# Patient Record
Sex: Male | Born: 1980 | Race: White | Hispanic: Yes | Marital: Married | Smoking: Former smoker
Health system: Southern US, Community
[De-identification: ages and names within clinical notes are randomized; demographics above are authoritative.]

## PROBLEM LIST (undated history)

## (undated) DIAGNOSIS — I10 Essential (primary) hypertension: Secondary | ICD-10-CM

## (undated) DIAGNOSIS — N200 Calculus of kidney: Secondary | ICD-10-CM

## (undated) HISTORY — PX: HERNIA REPAIR: SHX51

---

## 2006-01-09 ENCOUNTER — Emergency Department (HOSPITAL_COMMUNITY): Admission: EM | Admit: 2006-01-09 | Discharge: 2006-01-09 | Payer: Self-pay | Admitting: Emergency Medicine

## 2006-03-17 ENCOUNTER — Emergency Department (HOSPITAL_COMMUNITY): Admission: EM | Admit: 2006-03-17 | Discharge: 2006-03-17 | Payer: Self-pay | Admitting: Family Medicine

## 2007-01-16 ENCOUNTER — Emergency Department (HOSPITAL_COMMUNITY): Admission: EM | Admit: 2007-01-16 | Discharge: 2007-01-16 | Payer: Self-pay | Admitting: Emergency Medicine

## 2010-12-27 ENCOUNTER — Emergency Department (HOSPITAL_COMMUNITY)
Admission: EM | Admit: 2010-12-27 | Discharge: 2010-12-27 | Disposition: A | Discharge status: Home / Self Care | Payer: Self-pay | Attending: Emergency Medicine | Admitting: Emergency Medicine

## 2010-12-27 DIAGNOSIS — W268XXA Contact with other sharp object(s), not elsewhere classified, initial encounter: Secondary | ICD-10-CM | POA: Insufficient documentation

## 2010-12-27 DIAGNOSIS — S61509A Unspecified open wound of unspecified wrist, initial encounter: Secondary | ICD-10-CM | POA: Insufficient documentation

## 2010-12-27 DIAGNOSIS — Y92009 Unspecified place in unspecified non-institutional (private) residence as the place of occurrence of the external cause: Secondary | ICD-10-CM | POA: Insufficient documentation

## 2011-02-25 LAB — I-STAT 8, (EC8 V) (CONVERTED LAB)
Acid-Base Excess: 4 — ABNORMAL HIGH
BUN: 18
Bicarbonate: 26.1 — ABNORMAL HIGH
Chloride: 103
Glucose, Bld: 106 — ABNORMAL HIGH
HCT: 49
Hemoglobin: 16.7
Operator id: 279831
Potassium: 3.8
Sodium: 138
TCO2: 27
pCO2, Ven: 30.5 — ABNORMAL LOW
pH, Ven: 7.54 — ABNORMAL HIGH

## 2011-02-25 LAB — DIFFERENTIAL
Band Neutrophils: 0
Basophils Relative: 0
Blasts: 0
Eosinophils Relative: 1
Lymphocytes Relative: 24
Metamyelocytes Relative: 0
Monocytes Relative: 20 — ABNORMAL HIGH
Myelocytes: 0
Neutrophils Relative %: 55
Promyelocytes Absolute: 0
nRBC: 0

## 2011-02-25 LAB — CBC
HCT: 44.8
Hemoglobin: 15.5
MCHC: 34.6
MCV: 85.2
Platelets: 251
RBC: 5.26
RDW: 13.7
WBC: 6.2

## 2011-02-25 LAB — POCT CARDIAC MARKERS
CKMB, poc: <1 — ABNORMAL LOW
Myoglobin, poc: 33.9
Troponin i, poc: <0.05

## 2011-02-25 LAB — POCT I-STAT CREATININE
Creatinine, Ser: 0.9
Operator id: 279831

## 2011-02-25 LAB — D-DIMER, QUANTITATIVE: D-Dimer, Quant: 0.74 — ABNORMAL HIGH

## 2011-04-13 ENCOUNTER — Encounter (HOSPITAL_COMMUNITY): Payer: Self-pay | Admitting: Emergency Medicine

## 2011-04-13 ENCOUNTER — Emergency Department (INDEPENDENT_AMBULATORY_CARE_PROVIDER_SITE_OTHER): Payer: Self-pay

## 2011-04-13 ENCOUNTER — Emergency Department (INDEPENDENT_AMBULATORY_CARE_PROVIDER_SITE_OTHER)
Admission: EM | Admit: 2011-04-13 | Discharge: 2011-04-13 | Disposition: A | Discharge status: Home / Self Care | Payer: Self-pay | Source: Home / Self Care | Attending: Emergency Medicine | Admitting: Emergency Medicine

## 2011-04-13 DIAGNOSIS — S2239XA Fracture of one rib, unspecified side, initial encounter for closed fracture: Secondary | ICD-10-CM

## 2011-04-13 DIAGNOSIS — S2249XA Multiple fractures of ribs, unspecified side, initial encounter for closed fracture: Secondary | ICD-10-CM

## 2011-04-13 HISTORY — DX: Calculus of kidney: N20.0

## 2011-04-13 LAB — POCT URINALYSIS DIP (DEVICE)
Bilirubin Urine: NEGATIVE
Glucose, UA: NEGATIVE mg/dL
Hgb urine dipstick: NEGATIVE
Ketones, ur: NEGATIVE mg/dL
Leukocytes, UA: NEGATIVE
Nitrite: NEGATIVE
Protein, ur: NEGATIVE mg/dL
Specific Gravity, Urine: 1.015 (ref 1.005–1.030)
Urobilinogen, UA: 0.2 mg/dL (ref 0.0–1.0)
pH: 7 (ref 5.0–8.0)

## 2011-04-13 MED ORDER — OXYCODONE-ACETAMINOPHEN 5-325 MG PO TABS: 1.0000 | ORAL_TABLET | Freq: Four times a day (QID) | ORAL | Status: AC | PRN

## 2011-04-13 MED ORDER — IBUPROFEN 800 MG PO TABS: 800.0000 mg | ORAL_TABLET | Freq: Three times a day (TID) | ORAL | Status: AC | PRN

## 2011-04-13 NOTE — ED Provider Notes (Signed)
History     CSN: [id number] Arrival date & time: 04/13/2011 12:17 PM   First MD Initiated Contact with Patient 04/13/11 1007      Chief Complaint  Patient presents with  . Chest Pain    HPI Comments: Pt states was in his attic and accidenatlly fell through the celing 1 week ago. Sustained abrasion to right posterior chest wall. C/o sharp localised pain there and anteriorally between ribs 8-10. Has been taking motrin and percocet left over from prev kidney stone w/ temporary relief.  Woke up with morning, and heard a "pop" when moving. Pain then returned. No cough, wheeze, SOB, hemoptysis, N/V, fevers.   Patient is a 30 y.o. male presenting with chest pain. The history is provided by the patient.  Chest Pain The chest pain began 5 - 7 days ago. The pain is associated with breathing and coughing. The quality of the pain is described as sharp. The pain does not radiate. Chest pain is worsened by deep breathing and certain positions. Pertinent negatives for primary symptoms include no fever, no fatigue, no shortness of breath, no cough, no wheezing, no abdominal pain, no nausea and no vomiting. He tried NSAIDs and narcotics for the symptoms. Risk factors include no known risk factors.     Past Medical History  Diagnosis Date  . Kidney stones     History reviewed. No pertinent past surgical history.  History reviewed. No pertinent family history.  History  Substance Use Topics  . Smoking status: Never Smoker   . Smokeless tobacco: Not on file  . Alcohol Use: No      Review of Systems  Constitutional: Negative for fever and fatigue.  Respiratory: Negative for cough, shortness of breath and wheezing.   Cardiovascular: Positive for chest pain.  Gastrointestinal: Negative for nausea, vomiting and abdominal pain.  Genitourinary: Negative for hematuria.  Skin: Positive for wound.    Allergies  Review of patient's allergies indicates no known allergies.  Home Medications    Current Outpatient Rx  Name Route Sig Dispense Refill  . IBUPROFEN 200 MG PO TABS Oral Take 600 mg by mouth every 6 (six) hours as needed.      . OXYCODONE-ACETAMINOPHEN 5-325 MG PO TABS Oral Take 1 tablet by mouth every 4 (four) hours as needed.        BP 119/73  Pulse 77  Temp(Src) 98.6 F (37 C) (Oral)  Resp 16  SpO2 100%  Physical Exam  Nursing note and vitals reviewed. Constitutional: He is oriented to person, place, and time. He appears well-developed and well-nourished.  HENT:  Head: Normocephalic and atraumatic.  Eyes: Conjunctivae and EOM are normal. Pupils are equal, round, and reactive to light.  Neck: Normal range of motion.  Cardiovascular: Normal rate, regular rhythm and normal heart sounds.   Pulmonary/Chest: Effort normal and breath sounds normal. No respiratory distress.       Abrasion posteriorally around rib 8 right side. Localised tenderness. Tenderness anteriorally around rib 9-10 ? Crepitus. No bruising, deformity.  Abdominal: Soft. He exhibits no distension.  Musculoskeletal: Normal range of motion.  Neurological: He is alert and oriented to person, place, and time.  Skin: Skin is warm and dry.  Psychiatric: He has a normal mood and affect. His behavior is normal.    ED Course  Procedures (including critical care time)  Labs Reviewed - No data to display No results found.   No diagnosis found.  Dg Ribs Unilateral W/chest Right  04/13/2011  *RADIOLOGY REPORT*  Clinical Data: Trauma, fell through ceiling landing on a chair 1 week ago, pain and lower posterior lateral right ribs  RIGHT RIBS AND CHEST - 3+ VIEW  Comparison: Chest radiograph 01/16/2007  Findings: Upper-normal size of cardiac silhouette. Mediastinal contours and pulmonary vascularity normal. Minimal peribronchial thickening. Lungs clear. No pleural effusion or pneumothorax. BB placed at site of symptoms lower lateral right chest. Nondisplaced fractures lateral right seventh and eighth  ribs.  IMPRESSION: Minimal bronchitic changes. Nondisplaced fractures lateral right seventh and eighth ribs.  Original Report Authenticated By: Lollie Marrow, M.D.   Results for orders placed during the hospital encounter of 04/13/11  POCT URINALYSIS DIP (DEVICE)      Component Value Range   Glucose, UA NEGATIVE  NEGATIVE (mg/dL)   Bilirubin Urine NEGATIVE  NEGATIVE    Ketones, ur NEGATIVE  NEGATIVE (mg/dL)   Specific Gravity, Urine 1.015  1.005 - 1.030    Hgb urine dipstick NEGATIVE  NEGATIVE    pH 7.0  5.0 - 8.0    Protein, ur NEGATIVE  NEGATIVE (mg/dL)   Urobilinogen, UA 0.2  0.0 - 1.0 (mg/dL)   Nitrite NEGATIVE  NEGATIVE    Leukocytes, UA NEGATIVE  NEGATIVE      MDM  Pt seen and examined. Pt declined pain medication at this time. Checking XR to f/o fx, ua to r/o renal contusion.   Luiz Blare, MD 04/13/11 1400

## 2014-09-17 ENCOUNTER — Encounter: Payer: Self-pay | Admitting: Family

## 2014-09-17 ENCOUNTER — Ambulatory Visit (INDEPENDENT_AMBULATORY_CARE_PROVIDER_SITE_OTHER): Payer: BLUE CROSS/BLUE SHIELD | Admitting: Family

## 2014-09-17 VITALS — BP 132/78 | HR 73 | Temp 98.2°F | Resp 18 | Ht 64.0 in | Wt 182.0 lb

## 2014-09-17 DIAGNOSIS — R21 Rash and other nonspecific skin eruption: Secondary | ICD-10-CM

## 2014-09-17 DIAGNOSIS — K047 Periapical abscess without sinus: Secondary | ICD-10-CM | POA: Diagnosis not present

## 2014-09-17 MED ORDER — AMOXICILLIN-POT CLAVULANATE 875-125 MG PO TABS: 1.0000 | ORAL_TABLET | Freq: Two times a day (BID) | ORAL | Status: DC

## 2014-09-17 NOTE — Progress Notes (Signed)
Subjective:    Patient ID: Kyle Ortiz, male    DOB: 02-07-1981, 34 y.o.   MRN: [id number]  Chief Complaint  Patient presents with  . Establish Care    has a really bad tooth ache that has been going on for x3 days, says the pain gets so bad that he feels like he could pass out, he is having insurance issues right now and can not go to the dentist, also has a rash around private area and inbetween legs, x3 months or longer    HPI:  Kyle Ortiz is a 34 y.o. male who presents today for an office visit to establish care.  1.) Toothache - This is a new problem. Associated symptom of a toothache located on the right side of his mouth has been going on for about 3 days. Describes the pain is described as achy with a severity strong enough to make him feel as if he is going to pass out on occasion. Right now the pain is minimal. Modifying factors include Motrin which helped with the pain. He currently does not have dental insurance.  Reports his last dental cleaning was about 6-8 months ago.   2) Rash - This is a new problem. Associated symptom of a rash around his testicles that spreads to his inner thighs has been going on for about 3 months and longer. Describes it as coming and going. Describes the rash as itchy. He has used his wife's steroid cream which helped take away the itch. States that he does sweat more in that area.   No Known Allergies  No current outpatient prescriptions on file prior to visit.   No current facility-administered medications on file prior to visit.    Past Medical History  Diagnosis Date  . Kidney stones     History reviewed. No pertinent past surgical history.  History reviewed. No pertinent family history.  History   Social History  . Marital Status: Married    Spouse Name: N/A  . Number of Children: 1  . Years of Education: 14   Occupational History  . Technician     Social History Main Topics  . Smoking status: Never Smoker   . Smokeless  tobacco: Never Used  . Alcohol Use: Yes     Comment: Occasional  . Drug Use: Yes    Special: Marijuana  . Sexual Activity: Not on file   Other Topics Concern  . Not on file   Social History Narrative   Currently resides with his wife and child. Fun: Going to gym; travel, going out to eat   Denies religious beliefs effecting health care.     Review of Systems  Constitutional: Negative for fever and chills.  HENT: Positive for dental problem.   Skin: Positive for rash.      Objective:    BP 172/108 mmHg  Pulse 73  Temp(Src) 98.2 F (36.8 C) (Oral)  Resp 18  Ht 5\' 4"  (1.626 m)  Wt 182 lb (82.555 kg)  BMI 31.22 kg/m2  SpO2 98% Nursing note and vital signs reviewed.  Physical Exam  Constitutional: He is oriented to person, place, and time. He appears well-developed and well-nourished. No distress.  HENT:  Mouth/Throat: Oropharynx is clear and moist and mucous membranes are normal. Abnormal dentition. No dental abscesses. No oropharyngeal exudate.  Right lower wisdom tooth appears with potential dental caries with inflammation noted.   Cardiovascular: Normal rate, regular rhythm, normal heart sounds and intact distal pulses.  Pulmonary/Chest: Effort normal and breath sounds normal.  Genitourinary:  Slightly reddend rash with small papules. No obvious sign of infection noted.   Neurological: He is alert and oriented to person, place, and time.  Skin: Skin is warm and dry.  Psychiatric: He has a normal mood and affect. His behavior is normal. Judgment and thought content normal.       Assessment & Plan:

## 2014-09-17 NOTE — Patient Instructions (Signed)
  Thank you for choosing [facility].  Summary/Instructions:  Please follow up with a Dentist.   Your prescription(s) have been submitted to your pharmacy or been printed and provided for you. Please take as directed and contact our office if you believe you are having problem(s) with the medication(s) or have any questions.  If your symptoms worsen or fail to improve, please contact our office for further instruction, or in case of emergency go directly to the emergency room at the closest medical facility.     For your groin - please try Tinactin powder to enhance dryness. Start a Lotramin (clotrimazole) cream.    Dental Abscess A dental abscess is a collection of infected fluid (pus) from a bacterial infection in the inner part of the tooth (pulp). It usually occurs at the end of the tooth's root.  CAUSES   Severe tooth decay.  Trauma to the tooth that allows bacteria to enter into the pulp, such as a broken or chipped tooth. SYMPTOMS   Severe pain in and around the infected tooth.  Swelling and redness around the abscessed tooth or in the mouth or face.  Tenderness.  Pus drainage.  Bad breath.  Bitter taste in the mouth.  Difficulty swallowing.  Difficulty opening the mouth.  Nausea.  Vomiting.  Chills.  Swollen neck glands. DIAGNOSIS   A medical and dental history will be taken.  An examination will be performed by tapping on the abscessed tooth.  X-rays may be taken of the tooth to identify the abscess. TREATMENT The goal of treatment is to eliminate the infection. You may be prescribed antibiotic medicine to stop the infection from spreading. A root canal may be performed to save the tooth. If the tooth cannot be saved, it may be pulled (extracted) and the abscess may be drained.  HOME CARE INSTRUCTIONS  Only take over-the-counter or prescription medicines for pain, fever, or discomfort as directed by your caregiver.  Rinse your mouth (gargle)  often with salt water ( tsp salt in 8 oz [250 ml] of warm water) to relieve pain or swelling.  Do not drive after taking pain medicine (narcotics).  Do not apply heat to the outside of your face.  Return to your dentist for further treatment as directed. SEEK MEDICAL CARE IF:  Your pain is not helped by medicine.  Your pain is getting worse instead of better. SEEK IMMEDIATE MEDICAL CARE IF:  You have a fever or persistent symptoms for more than 2-3 days.  You have a fever and your symptoms suddenly get worse.  You have chills or a very bad headache.  You have problems breathing or swallowing.  You have trouble opening your mouth.  You have swelling in the neck or around the eye. Document Released: 05/02/2005 Document Revised: 01/25/2012 Document Reviewed: 08/10/2010 [facility] Patient Information 2015 [place], [place]. This information is not intended to replace advice given to you by your health care provider. Make sure you discuss any questions you have with your health care provider.

## 2014-09-17 NOTE — Progress Notes (Signed)
Pre visit review using our clinic review tool, if applicable. No additional management support is needed unless otherwise documented below in the visit note. 

## 2014-09-17 NOTE — Assessment & Plan Note (Signed)
Right lower wisdom tooth appears infected. Start Augmentin. Follow up with dentist for potential removal of tooth. Ibuprofen 600-800 mg three times daily as needed for pain. Follow up if symptoms worsen or fail to improve.

## 2014-09-17 NOTE — Assessment & Plan Note (Signed)
Questionable tinea corpus of groin noted. Start clotrimazole. Instructed to keep area clean and try. Consider tinactin powder as needed.  Follow up if symptoms worsen or fail to improve.

## 2014-12-24 ENCOUNTER — Emergency Department (HOSPITAL_COMMUNITY)
Admission: EM | Admit: 2014-12-24 | Discharge: 2014-12-24 | Disposition: A | Discharge status: Home / Self Care | Payer: BLUE CROSS/BLUE SHIELD | Source: Home / Self Care | Attending: Family Medicine | Admitting: Family Medicine

## 2014-12-24 ENCOUNTER — Encounter (HOSPITAL_COMMUNITY): Payer: Self-pay | Admitting: Emergency Medicine

## 2014-12-24 ENCOUNTER — Emergency Department (HOSPITAL_COMMUNITY)
Admission: EM | Admit: 2014-12-24 | Discharge: 2014-12-24 | Disposition: A | Discharge status: Home / Self Care | Payer: Self-pay

## 2014-12-24 DIAGNOSIS — S0501XA Injury of conjunctiva and corneal abrasion without foreign body, right eye, initial encounter: Secondary | ICD-10-CM | POA: Diagnosis not present

## 2014-12-24 DIAGNOSIS — S0181XA Laceration without foreign body of other part of head, initial encounter: Secondary | ICD-10-CM

## 2014-12-24 MED ORDER — POLYMYXIN B-TRIMETHOPRIM 10000-0.1 UNIT/ML-% OP SOLN: 2.0000 [drp] | Freq: Four times a day (QID) | OPHTHALMIC | Status: DC

## 2014-12-24 MED ORDER — FLUORESCEIN SODIUM 1 MG OP STRP
ORAL_STRIP | OPHTHALMIC | Status: AC
  Filled 2014-12-24: qty 1

## 2014-12-24 MED ORDER — TETRACAINE HCL 0.5 % OP SOLN
OPHTHALMIC | Status: AC
  Filled 2014-12-24: qty 2

## 2014-12-24 NOTE — ED Provider Notes (Signed)
CSN: [id number]     Arrival date & time 12/24/14  1303 History   First MD Initiated Contact with Patient 12/24/14 1326     Chief Complaint  Patient presents with  . Eye Injury   (Consider location/radiation/quality/duration/timing/severity/associated sxs/prior Treatment) HPI Approximately 12 hours ago patient was working in his shop with a small perineal nose pliers and was trying to bend another piece of metal metal when the pliers flew out of his hand and struck him in the cheek and right eye. Areas were immediately painful. Small bleeding from his cheek. This stopped with pressure. Patient states that he rates his eye with water which resolved the stinging in his eye. Denies any vision change, persistent pain, headache, nausea, vomiting. Last tetanus was in 2012.      Past Medical History  Diagnosis Date  . Kidney stones    History reviewed. No pertinent past surgical history. Family History  Problem Relation Age of Onset  . Cancer Neg Hx   . Diabetes Neg Hx   . Heart failure Neg Hx   . Hyperlipidemia Neg Hx    Social History  Substance Use Topics  . Smoking status: Never Smoker   . Smokeless tobacco: Never Used  . Alcohol Use: Yes     Comment: Occasional    Review of Systems Per HPI with all other pertinent systems negative.   Allergies  Review of patient's allergies indicates no known allergies.  Home Medications   Prior to Admission medications   Medication Sig Start Date End Date Taking? Authorizing Provider  trimethoprim-polymyxin b (POLYTRIM) ophthalmic solution Place 2 drops into the right eye every 6 (six) hours. Treat for 5-7 days 12/24/14   Ozella Rocks, MD   BP 136/80 mmHg  Pulse 82  Temp(Src) 97.4 F (36.3 C) (Oral)  Resp 18  SpO2 98% Physical Exam Physical Exam  Constitutional: oriented to person, place, and time. appears well-developed and well-nourished. No distress.  HENT:  Head: Normocephalic and atraumatic.  Eyes: Scleral injection,  floor seen exam yielding right medial corneal abrasion. Small. No vitreous humor leak appreciated.  Neck: Normal range of motion.  Cardiovascular: RRR, no m/r/g, 2+ distal pulses,  Pulmonary/Chest: Effort normal and breath sounds normal. No respiratory distress.  Abdominal: Soft. Bowel sounds are normal. NonTTP, no distension.  Musculoskeletal: Normal range of motion. Non ttp, no effusion.  Neurological: alert and oriented to person, place, and time.  Skin: 0.6 cm right upper cheek laceration.Marland Kitchen  Psychiatric: normal mood and affect. behavior is normal. Judgment and thought content normal.   ED Course  Procedures (including critical care time) Labs Review Labs Reviewed - No data to display  Imaging Review No results found.   MDM   1. Corneal abrasion, right, initial encounter   2. Facial laceration, initial encounter    Small cheek laceration. There was cleaned with copious mouth normal saline and sterile bandages and a small single Steri-Strip was applied with close wound approximation. Patient has sustained a corneal abrasion would benefit from Polytrim drops. No need for ophthalmology follow-up unless patient develops other symptoms. Tetanus is up-to-date.    Ozella Rocks, MD 12/24/14 1400

## 2014-12-24 NOTE — Discharge Instructions (Signed)
You have a very small scratch to the outer layer of the eye, corneal abrasion. Please have the antibiotic drops to clear out and prevent any infection. He also had a small laceration to your cheek that went into the deep tissue of the cheek. This was closed with a Steri-Strip. This will come off in 5-10 days. Please watch for signs of infection though this is unlikely. There are no specific wound treatments you need to perform for your cheek.

## 2014-12-24 NOTE — ED Notes (Signed)
Pt was working on his vehicle when his hand slipped and he hit his right eye with his needle nose pliers.  The eye is red, but he states it is not painful.  He also has a very small laceration on his cheek, below his eye.

## 2015-01-20 ENCOUNTER — Encounter: Payer: BLUE CROSS/BLUE SHIELD | Admitting: Family

## 2015-01-20 DIAGNOSIS — Z0289 Encounter for other administrative examinations: Secondary | ICD-10-CM

## 2015-01-22 ENCOUNTER — Emergency Department (HOSPITAL_COMMUNITY)
Admission: EM | Admit: 2015-01-22 | Discharge: 2015-01-22 | Disposition: A | Discharge status: Home / Self Care | Payer: BLUE CROSS/BLUE SHIELD | Attending: Emergency Medicine | Admitting: Emergency Medicine

## 2015-01-22 ENCOUNTER — Encounter (HOSPITAL_COMMUNITY): Payer: Self-pay

## 2015-01-22 DIAGNOSIS — Y288XXA Contact with other sharp object, undetermined intent, initial encounter: Secondary | ICD-10-CM | POA: Diagnosis not present

## 2015-01-22 DIAGNOSIS — Y9289 Other specified places as the place of occurrence of the external cause: Secondary | ICD-10-CM | POA: Insufficient documentation

## 2015-01-22 DIAGNOSIS — S61411A Laceration without foreign body of right hand, initial encounter: Secondary | ICD-10-CM | POA: Insufficient documentation

## 2015-01-22 DIAGNOSIS — Y998 Other external cause status: Secondary | ICD-10-CM | POA: Insufficient documentation

## 2015-01-22 DIAGNOSIS — I10 Essential (primary) hypertension: Secondary | ICD-10-CM | POA: Diagnosis not present

## 2015-01-22 DIAGNOSIS — Y9389 Activity, other specified: Secondary | ICD-10-CM | POA: Diagnosis not present

## 2015-01-22 DIAGNOSIS — S6991XA Unspecified injury of right wrist, hand and finger(s), initial encounter: Secondary | ICD-10-CM | POA: Diagnosis present

## 2015-01-22 DIAGNOSIS — Z87442 Personal history of urinary calculi: Secondary | ICD-10-CM | POA: Diagnosis not present

## 2015-01-22 HISTORY — DX: Essential (primary) hypertension: I10

## 2015-01-22 MED ORDER — LIDOCAINE HCL 1 % IJ SOLN
INTRAMUSCULAR | Status: AC
  Filled 2015-01-22: qty 20

## 2015-01-22 NOTE — ED Notes (Signed)
Pt c/o lightheaded and nausea. Pt states hx of same. Water and juice given.

## 2015-01-22 NOTE — ED Provider Notes (Signed)
CSN: [id number]     Arrival date & time 01/22/15  5784 History   First MD Initiated Contact with Patient 01/22/15 [phone]     Chief Complaint  Patient presents with  . Laceration     (Consider location/radiation/quality/duration/timing/severity/associated sxs/prior Treatment) Patient is a 34 y.o. male presenting with skin laceration. The history is provided by the patient. No language interpreter was used.  Laceration Location:  Hand Hand laceration location:  R hand Depth:  Cutaneous Quality: straight   Bleeding: controlled   Time since incident:  1 hour Laceration mechanism:  Metal edge Pain details:    Quality:  Dull Foreign body present:  No foreign bodies Tetanus status:  Up to date   Past Medical History  Diagnosis Date  . Kidney stones   . Hypertension    History reviewed. No pertinent past surgical history. Family History  Problem Relation Age of Onset  . Cancer Neg Hx   . Diabetes Neg Hx   . Heart failure Neg Hx   . Hyperlipidemia Neg Hx    Social History  Substance Use Topics  . Smoking status: Never Smoker   . Smokeless tobacco: Never Used  . Alcohol Use: Yes     Comment: Occasional    Review of Systems  Skin: Positive for wound.  All other systems reviewed and are negative.     Allergies  Review of patient's allergies indicates no known allergies.  Home Medications   Prior to Admission medications   Medication Sig Start Date End Date Taking? Authorizing Provider  trimethoprim-polymyxin b (POLYTRIM) ophthalmic solution Place 2 drops into the right eye every 6 (six) hours. Treat for 5-7 days 12/24/14   Ozella Rocks, MD   BP 140/92 mmHg  Pulse 82  Temp(Src) 97.8 F (36.6 C) (Oral)  Resp 18  Wt 175 lb (79.379 kg)  SpO2 99% Physical Exam  Constitutional: He is oriented to person, place, and time. He appears well-nourished. No distress.  HENT:  Head: Normocephalic.  Eyes: Conjunctivae are normal.  Neck: Neck supple.  Cardiovascular:  Normal rate and regular rhythm.   Pulmonary/Chest: Effort normal and breath sounds normal.  Abdominal: Soft. Bowel sounds are normal.  Musculoskeletal: He exhibits tenderness. He exhibits no edema.       Right hand: He exhibits laceration. He exhibits normal range of motion. Normal sensation noted. Normal strength noted.       Hands: Lymphadenopathy:    He has no cervical adenopathy.  Neurological: He is alert and oriented to person, place, and time.  Skin: Skin is warm and dry.  Psychiatric: He has a normal mood and affect.  Nursing note and vitals reviewed.   ED Course  Procedures (including critical care time)  LACERATION REPAIR Performed by: Jimmye Norman Authorized by: Jimmye Norman Consent: Verbal consent obtained. Risks and benefits: risks, benefits and alternatives were discussed Consent given by: patient Patient identity confirmed: provided demographic data Prepped and Draped in normal sterile fashion Wound explored  Laceration Location: right hand above 2nd MP  Laceration Length: 2 cm  No Foreign Bodies seen or palpated  Anesthesia: local infiltration  Local anesthetic: lidocaine 1%   Anesthetic total: 2 ml  Irrigation method: syringe  Amount of cleaning: standard  Skin closure: 4.0 prolene  Number of sutures: 3  Technique: simple interrupted  Patient tolerance: Patient tolerated the procedure well with no immediate complications. Labs Review Labs Reviewed - No data to display  Imaging Review No results found. I have personally reviewed and  evaluated these images and lab results as part of my medical decision-making.   EKG Interpretation None      MDM   Final diagnoses:  None    Right hand laceration. No tendon involvement. Sutured wound closure. Tetanus up to date. Suture removal in 5-7 days. Return precautions discussed.    Felicie Morn, NP 01/22/15 1050  Laurence Spates, MD 01/22/15 [phone]

## 2015-01-22 NOTE — Discharge Instructions (Signed)
Laceration Care, Adult °A laceration is a cut or lesion that goes through all layers of the skin and into the tissue just beneath the skin. °TREATMENT  °Some lacerations may not require closure. Some lacerations may not be able to be closed due to an increased risk of infection. It is important to see your caregiver as soon as possible after an injury to minimize the risk of infection and maximize the opportunity for successful closure. °If closure is appropriate, pain medicines may be given, if needed. The wound will be cleaned to help prevent infection. Your caregiver will use stitches (sutures), staples, wound glue (adhesive), or skin adhesive strips to repair the laceration. These tools bring the skin edges together to allow for faster healing and a better cosmetic outcome. However, all wounds will heal with a scar. Once the wound has healed, scarring can be minimized by covering the wound with sunscreen during the day for 1 full year. °HOME CARE INSTRUCTIONS  °For sutures or staples: °· Keep the wound clean and dry. °· If you were given a bandage (dressing), you should change it at least once a day. Also, change the dressing if it becomes wet or dirty, or as directed by your caregiver. °· Wash the wound with soap and water 2 times a day. Rinse the wound off with water to remove all soap. Pat the wound dry with a clean towel. °· After cleaning, apply a thin layer of the antibiotic ointment as recommended by your caregiver. This will help prevent infection and keep the dressing from sticking. °· You may shower as usual after the first 24 hours. Do not soak the wound in water until the sutures are removed. °· Only take over-the-counter or prescription medicines for pain, discomfort, or fever as directed by your caregiver. °· Get your sutures or staples removed as directed by your caregiver. °For skin adhesive strips: °· Keep the wound clean and dry. °· Do not get the skin adhesive strips wet. You may bathe  carefully, using caution to keep the wound dry. °· If the wound gets wet, pat it dry with a clean towel. °· Skin adhesive strips will fall off on their own. You may trim the strips as the wound heals. Do not remove skin adhesive strips that are still stuck to the wound. They will fall off in time. °For wound adhesive: °· You may briefly wet your wound in the shower or bath. Do not soak or scrub the wound. Do not swim. Avoid periods of heavy perspiration until the skin adhesive has fallen off on its own. After showering or bathing, gently pat the wound dry with a clean towel. °· Do not apply liquid medicine, cream medicine, or ointment medicine to your wound while the skin adhesive is in place. This may loosen the film before your wound is healed. °· If a dressing is placed over the wound, be careful not to apply tape directly over the skin adhesive. This may cause the adhesive to be pulled off before the wound is healed. °· Avoid prolonged exposure to sunlight or tanning lamps while the skin adhesive is in place. Exposure to ultraviolet light in the first year will darken the scar. °· The skin adhesive will usually remain in place for 5 to 10 days, then naturally fall off the skin. Do not pick at the adhesive film. °You may need a tetanus shot if: °· You cannot remember when you had your last tetanus shot. °· You have never had a tetanus   shot. If you get a tetanus shot, your arm may swell, get red, and feel warm to the touch. This is common and not a problem. If you need a tetanus shot and you choose not to have one, there is a rare chance of getting tetanus. Sickness from tetanus can be serious. SEEK MEDICAL CARE IF:   You have redness, swelling, or increasing pain in the wound.  You see a red line that goes away from the wound.  You have yellowish-white fluid (pus) coming from the wound.  You have a fever.  You notice a bad smell coming from the wound or dressing.  Your wound breaks open before or  after sutures have been removed.  You notice something coming out of the wound such as wood or glass.  Your wound is on your hand or foot and you cannot move a finger or toe. SEEK IMMEDIATE MEDICAL CARE IF:   Your pain is not controlled with prescribed medicine.  You have severe swelling around the wound causing pain and numbness or a change in color in your arm, hand, leg, or foot.  Your wound splits open and starts bleeding.  You have worsening numbness, weakness, or loss of function of any joint around or beyond the wound.  You develop painful lumps near the wound or on the skin anywhere on your body. MAKE SURE YOU:   Understand these instructions.  Will watch your condition.  Will get help right away if you are not doing well or get worse. Document Released: 05/02/2005 Document Revised: 07/25/2011 Document Reviewed: 10/26/2010 [facility] Patient Information 2015 [place], [place]. This information is not intended to replace advice given to you by your health care provider. Make sure you discuss any questions you have with your health care provider.  SUTURE REMOVAL IN 5-7 DAYS.  PROTECT AREA FROM INJURY (WEAR MECHANIC GLOVES).

## 2015-01-22 NOTE — ED Notes (Signed)
Provider at bedside

## 2015-01-22 NOTE — ED Notes (Signed)
Laceration to right hand 2cm bleeding controlled.

## 2015-01-22 NOTE — ED Notes (Signed)
Pt denies any complaints at this time. Respirations easy non labored. 

## 2015-01-29 ENCOUNTER — Encounter: Payer: Self-pay | Admitting: Family

## 2015-01-29 ENCOUNTER — Ambulatory Visit (INDEPENDENT_AMBULATORY_CARE_PROVIDER_SITE_OTHER): Payer: BLUE CROSS/BLUE SHIELD | Admitting: Family

## 2015-01-29 ENCOUNTER — Other Ambulatory Visit (INDEPENDENT_AMBULATORY_CARE_PROVIDER_SITE_OTHER): Payer: BLUE CROSS/BLUE SHIELD

## 2015-01-29 VITALS — BP 120/82 | HR 73 | Temp 98.4°F | Resp 18 | Ht 64.0 in | Wt 182.0 lb

## 2015-01-29 DIAGNOSIS — Z0000 Encounter for general adult medical examination without abnormal findings: Secondary | ICD-10-CM

## 2015-01-29 DIAGNOSIS — Z23 Encounter for immunization: Secondary | ICD-10-CM | POA: Diagnosis not present

## 2015-01-29 LAB — COMPREHENSIVE METABOLIC PANEL
ALT: 14 U/L (ref 0–53)
AST: 16 U/L (ref 0–37)
Albumin: 4.6 g/dL (ref 3.5–5.2)
Alkaline Phosphatase: 52 U/L (ref 39–117)
BUN: 17 mg/dL (ref 6–23)
CO2: 32 mEq/L (ref 19–32)
Calcium: 9.8 mg/dL (ref 8.4–10.5)
Chloride: 104 mEq/L (ref 96–112)
Creatinine, Ser: 0.92 mg/dL (ref 0.40–1.50)
GFR: 99.24 mL/min (ref >60.00)
Glucose, Bld: 90 mg/dL (ref 70–99)
Potassium: 4.8 mEq/L (ref 3.5–5.1)
Sodium: 140 mEq/L (ref 135–145)
Total Bilirubin: 0.6 mg/dL (ref 0.2–1.2)
Total Protein: 7.5 g/dL (ref 6.0–8.3)

## 2015-01-29 LAB — CBC
HCT: 44.5 % (ref 39.0–52.0)
Hemoglobin: 15.1 g/dL (ref 13.0–17.0)
MCHC: 34 g/dL (ref 30.0–36.0)
MCV: 86.5 fl (ref 78.0–100.0)
Platelets: 335 10*3/uL (ref 150.0–400.0)
RBC: 5.14 Mil/uL (ref 4.22–5.81)
RDW: 13.9 % (ref 11.5–15.5)
WBC: 7.3 10*3/uL (ref 4.0–10.5)

## 2015-01-29 LAB — LIPID PANEL
Cholesterol: 170 mg/dL (ref 0–200)
HDL: 45.8 mg/dL (ref >39.00)
LDL Cholesterol: 113 mg/dL — ABNORMAL HIGH (ref 0–99)
NonHDL: 124.64
Total CHOL/HDL Ratio: 4
Triglycerides: 57 mg/dL (ref 0.0–149.0)
VLDL: 11.4 mg/dL (ref 0.0–40.0)

## 2015-01-29 LAB — TSH: TSH: 1.05 u[IU]/mL (ref 0.35–4.50)

## 2015-01-29 NOTE — Progress Notes (Signed)
Subjective:    Patient ID: Kyle Ortiz, male    DOB: May 26, 1980, 34 y.o.   MRN: [id number]  Chief Complaint  Patient presents with  . CPE    Fasting    HPI:  Kyle Ortiz is a 34 y.o. male who presents today for an annual wellness visit.   1) Health Maintenance -   Diet - Averages about 3 meals per day consisting of chicken, [place], rice, legumes, fruit, occasional vegetable; Caffeine intake of about 4-5 cups per day  Exercise - Walking daily.    2) Preventative Exams / Immunizations:  Dental -- Due for exam  Vision -- Due exam   Health Maintenance  Topic Date Due  . HIV Screening  08/13/1994  . INFLUENZA VACCINE  12/15/2014  . TETANUS/TDAP  01/28/2025    Immunization History  Administered Date(s) Administered  . Influenza,inj,Quad PF,36+ Mos 01/29/2015  . Tdap 01/29/2015    No Known Allergies   Outpatient Prescriptions Prior to Visit  Medication Sig Dispense Refill  . trimethoprim-polymyxin b (POLYTRIM) ophthalmic solution Place 2 drops into the right eye every 6 (six) hours. Treat for 5-7 days (Patient not taking: Reported on 01/22/2015) 10 mL 0   No facility-administered medications prior to visit.     Past Medical History  Diagnosis Date  . Kidney stones   . Hypertension      History reviewed. No pertinent past surgical history.   Family History  Problem Relation Age of Onset  . Cancer Neg Hx   . Diabetes Neg Hx   . Heart failure Neg Hx   . Hyperlipidemia Neg Hx   . Healthy Mother   . Healthy Father      Social History   Social History  . Marital Status: Married    Spouse Name: N/A  . Number of Children: 1  . Years of Education: 14   Occupational History  . Technician     Social History Main Topics  . Smoking status: Never Smoker   . Smokeless tobacco: Never Used  . Alcohol Use: Yes     Comment: Occasional  . Drug Use: Yes    Special: Marijuana  . Sexual Activity: Not on file   Other Topics Concern  . Not on file    Social History Narrative   Currently resides with his wife and child. Fun: Going to gym; travel, going out to eat   Denies religious beliefs effecting health care.     Review of Systems  Constitutional: Denies fever, chills, fatigue, or significant weight gain/loss. HENT: Head: Denies headache or neck pain Ears: Denies changes in hearing, ringing in ears, earache, drainage Nose: Denies discharge, stuffiness, itching, nosebleed, sinus pain Throat: Denies sore throat, hoarseness, dry mouth, sores, thrush Eyes: Denies loss/changes in vision, pain, redness, blurry/double vision, flashing lights Cardiovascular: Denies chest pain/discomfort, tightness, palpitations, shortness of breath with activity, difficulty lying down, swelling, sudden awakening with shortness of breath Respiratory: Denies shortness of breath, cough, sputum production, wheezing Gastrointestinal: Denies dysphasia, heartburn, change in appetite, nausea, change in bowel habits, rectal bleeding, constipation, diarrhea, yellow skin or eyes Genitourinary: Denies frequency, urgency, burning/pain, blood in urine, incontinence, change in urinary strength. Musculoskeletal: Denies muscle/joint pain, stiffness, back pain, redness or swelling of joints, trauma Skin: Denies rashes, lumps, itching, dryness, color changes, or hair/nail changes Neurological: Denies dizziness, fainting, seizures, weakness, numbness, tingling, tremor Psychiatric - Denies nervousness, stress, depression or memory loss Endocrine: Denies heat or cold intolerance, sweating, frequent urination, excessive thirst, changes in appetite  Hematologic: Denies ease of bruising or bleeding     Objective:     BP 120/82 mmHg  Pulse 73  Temp(Src) 98.4 F (36.9 C) (Oral)  Resp 18  Ht  (1.626 m)  Wt 182 lb (82.555 kg)  BMI 31.22 kg/m2  SpO2 99% Nursing note and vital signs reviewed.  Physical Exam  Constitutional: He is oriented to person, place, and time.  He appears well-developed and well-nourished. No distress.  HENT:  Head: Normocephalic.  Right Ear: Hearing, tympanic membrane, external ear and ear canal normal.  Left Ear: Hearing, tympanic membrane, external ear and ear canal normal.  Nose: Nose normal.  Mouth/Throat: Uvula is midline, oropharynx is clear and moist and mucous membranes are normal.  Eyes: Conjunctivae and EOM are normal. Pupils are equal, round, and reactive to light.  Neck: Neck supple. No JVD present. No tracheal deviation present. No thyromegaly present.  Cardiovascular: Normal rate, regular rhythm, normal heart sounds and intact distal pulses.   Pulmonary/Chest: Effort normal and breath sounds normal.  Abdominal: Soft. Bowel sounds are normal. He exhibits no distension and no mass. There is no tenderness. There is no rebound and no guarding.  Musculoskeletal: Normal range of motion. He exhibits no edema or tenderness.  Lymphadenopathy:    He has no cervical adenopathy.  Neurological: He is alert and oriented to person, place, and time. He has normal reflexes. No cranial nerve deficit. He exhibits normal muscle tone. Coordination normal.  Skin: Skin is warm and dry.  Psychiatric: He has a normal mood and affect. His behavior is normal. Judgment and thought content normal.       Assessment & Plan:   Problem List Items Addressed This Visit      Other   Routine general medical examination at a health care facility - Primary    1) Anticipatory Guidance: Discussed importance of wearing a seatbelt while driving and not texting while driving; changing batteries in smoke detector at least once annually; wearing suntan lotion when outside; eating a balanced and moderate diet; getting physical activity at least 30 minutes per day.  2) Immunizations / Screenings / Labs:  Tetanus and flu updated today. All other immunizations are up-to-date per recommendations. Due for a vision and dental screen which will be scheduled  independently. All other immunizations are up-to-date per recommendations. Obtain CBC, CMET, Lipid profile and TSH.   Overall well exam with minimal risk factors for cardiovascular disease. Discussed importance of adequate nutrition and emphasizing nutrient dense foods including fruits and vegetables. Continue exercise with goal of 30 minutes most days of the week. Discussed risks and benefits of continued marijuana use. Continue other healthy lifestyle choices and behaviors. Follow up prevention exam in 1 year. Follow-up office visit pending blood work.       Relevant Orders   CBC   Comprehensive metabolic panel   TSH   Lipid panel    Other Visit Diagnoses    Need for diphtheria-tetanus-pertussis (Tdap) vaccine, adult/adolescent        Relevant Orders    Tdap vaccine greater than or equal to 7yo IM (Completed)    Encounter for immunization

## 2015-01-29 NOTE — Progress Notes (Signed)
Pre visit review using our clinic review tool, if applicable. No additional management support is needed unless otherwise documented below in the visit note. 

## 2015-01-29 NOTE — Assessment & Plan Note (Addendum)
1) Anticipatory Guidance: Discussed importance of wearing a seatbelt while driving and not texting while driving; changing batteries in smoke detector at least once annually; wearing suntan lotion when outside; eating a balanced and moderate diet; getting physical activity at least 30 minutes per day.  2) Immunizations / Screenings / Labs:  Tetanus and flu updated today. All other immunizations are up-to-date per recommendations. Due for a vision and dental screen which will be scheduled independently. All other immunizations are up-to-date per recommendations. Obtain CBC, CMET, Lipid profile and TSH.   Overall well exam with minimal risk factors for cardiovascular disease. Discussed importance of adequate nutrition and emphasizing nutrient dense foods including fruits and vegetables. Continue exercise with goal of 30 minutes most days of the week. Discussed risks and benefits of continued marijuana use. Continue other healthy lifestyle choices and behaviors. Follow up prevention exam in 1 year. Follow-up office visit pending blood work.

## 2015-01-29 NOTE — Patient Instructions (Addendum)
Thank you for choosing [facility].  Summary/Instructions:  Please stop by the lab on the basement level of the building for your blood work. Your results will be released to MyChart (or called to you) after review, usually within 72 hours after test completion. If any changes need to be made, you will be notified at that same time.  If your symptoms worsen or fail to improve, please contact our office for further instruction, or in case of emergency go directly to the emergency room at the closest medical facility.   Health Maintenance A healthy lifestyle and preventative care can promote health and wellness.  Maintain regular health, dental, and eye exams.  Eat a healthy diet. Foods like vegetables, fruits, whole grains, low-fat dairy products, and lean protein foods contain the nutrients you need and are low in calories. Decrease your intake of foods high in solid fats, added sugars, and salt. Get information about a proper diet from your health care provider, if necessary.  Regular physical exercise is one of the most important things you can do for your health. Most adults should get at least 150 minutes of moderate-intensity exercise (any activity that increases your heart rate and causes you to sweat) each week. In addition, most adults need muscle-strengthening exercises on 2 or more days a week.   Maintain a healthy weight. The body mass index (BMI) is a screening tool to identify possible weight problems. It provides an estimate of body fat based on height and weight. Your health care provider can find your BMI and can help you achieve or maintain a healthy weight. For males 20 years and older:  A BMI below 18.5 is considered underweight.  A BMI of 18.5 to 24.9 is normal.  A BMI of 25 to 29.9 is considered overweight.  A BMI of 30 and above is considered obese.  Maintain normal blood lipids and cholesterol by exercising and minimizing your intake of saturated fat. Eat a  balanced diet with plenty of fruits and vegetables. Blood tests for lipids and cholesterol should begin at age 20 and be repeated every 5 years. If your lipid or cholesterol levels are high, you are over age 50, or you are at high risk for heart disease, you may need your cholesterol levels checked more frequently.Ongoing high lipid and cholesterol levels should be treated with medicines if diet and exercise are not working.  If you smoke, find out from your health care provider how to quit. If you do not use tobacco, do not start.  Lung cancer screening is recommended for adults aged 55-80 years who are at high risk for developing lung cancer because of a history of smoking. A yearly low-dose CT scan of the lungs is recommended for people who have at least a 30-pack-year history of smoking and are current smokers or have quit within the past 15 years. A pack year of smoking is smoking an average of 1 pack of cigarettes a day for 1 year (for example, a 30-pack-year history of smoking could mean smoking 1 pack a day for 30 years or 2 packs a day for 15 years). Yearly screening should continue until the smoker has stopped smoking for at least 15 years. Yearly screening should be stopped for people who develop a health problem that would prevent them from having lung cancer treatment.  If you choose to drink alcohol, do not have more than 2 drinks per day. One drink is considered to be 12 oz (360 mL) of beer,   5 oz (150 mL) of wine, or 1.5 oz (45 mL) of liquor.  Avoid the use of street drugs. Do not share needles with anyone. Ask for help if you need support or instructions about stopping the use of drugs.  High blood pressure causes heart disease and increases the risk of stroke. Blood pressure should be checked at least every 1-2 years. Ongoing high blood pressure should be treated with medicines if weight loss and exercise are not effective.  If you are 45-79 years old, ask your health care provider if  you should take aspirin to prevent heart disease.  Diabetes screening involves taking a blood sample to check your fasting blood sugar level. This should be done once every 3 years after age 45 if you are at a normal weight and without risk factors for diabetes. Testing should be considered at a younger age or be carried out more frequently if you are overweight and have at least 1 risk factor for diabetes.  Colorectal cancer can be detected and often prevented. Most routine colorectal cancer screening begins at the age of 50 and continues through age 75. However, your health care provider may recommend screening at an earlier age if you have risk factors for colon cancer. On a yearly basis, your health care provider may provide home test kits to check for hidden blood in the stool. A small camera at the end of a tube may be used to directly examine the colon (sigmoidoscopy or colonoscopy) to detect the earliest forms of colorectal cancer. Talk to your health care provider about this at age 50 when routine screening begins. A direct exam of the colon should be repeated every 5-10 years through age 75, unless early forms of precancerous polyps or small growths are found.  People who are at an increased risk for hepatitis B should be screened for this virus. You are considered at high risk for hepatitis B if:  You were born in a country where hepatitis B occurs often. Talk with your health care provider about which countries are considered high risk.  Your parents were born in a high-risk country and you have not received a shot to protect against hepatitis B (hepatitis B vaccine).  You have HIV or AIDS.  You use needles to inject street drugs.  You live with, or have sex with, someone who has hepatitis B.  You are a man who has sex with other men (MSM).  You get hemodialysis treatment.  You take certain medicines for conditions like cancer, organ transplantation, and autoimmune  conditions.  Hepatitis C blood testing is recommended for all people born from 1945 through 1965 and any individual with known risk factors for hepatitis C.  Healthy men should no longer receive prostate-specific antigen (PSA) blood tests as part of routine cancer screening. Talk to your health care provider about prostate cancer screening.  Testicular cancer screening is not recommended for adolescents or adult males who have no symptoms. Screening includes self-exam, a health care provider exam, and other screening tests. Consult with your health care provider about any symptoms you have or any concerns you have about testicular cancer.  Practice safe sex. Use condoms and avoid high-risk sexual practices to reduce the spread of sexually transmitted infections (STIs).  You should be screened for STIs, including gonorrhea and chlamydia if:  You are sexually active and are younger than 24 years.  You are older than 24 years, and your health care provider tells you that you are at   risk for this type of infection.  Your sexual activity has changed since you were last screened, and you are at an increased risk for chlamydia or gonorrhea. Ask your health care provider if you are at risk.  If you are at risk of being infected with HIV, it is recommended that you take a prescription medicine daily to prevent HIV infection. This is called pre-exposure prophylaxis (PrEP). You are considered at risk if:  You are a man who has sex with other men (MSM).  You are a heterosexual man who is sexually active with multiple partners.  You take drugs by injection.  You are sexually active with a partner who has HIV.  Talk with your health care provider about whether you are at high risk of being infected with HIV. If you choose to begin PrEP, you should first be tested for HIV. You should then be tested every 3 months for as long as you are taking PrEP.  Use sunscreen. Apply sunscreen liberally and  repeatedly throughout the day. You should seek shade when your shadow is shorter than you. Protect yourself by wearing long sleeves, pants, a wide-brimmed hat, and sunglasses year round whenever you are outdoors.  Tell your health care provider of new moles or changes in moles, especially if there is a change in shape or color. Also, tell your health care provider if a mole is larger than the size of a pencil eraser.  A one-time screening for abdominal aortic aneurysm (AAA) and surgical repair of large AAAs by ultrasound is recommended for men aged 65-75 years who are current or former smokers.  Stay current with your vaccines (immunizations). Document Released: 10/29/2007 Document Revised: 05/07/2013 Document Reviewed: 09/27/2010 [employer] Patient Information 2015 [employer]. This information is not intended to replace advice given to you by your health care provider. Make sure you discuss any questions you have with your health care provider.  

## 2015-02-01 ENCOUNTER — Telehealth: Payer: Self-pay | Admitting: Family

## 2015-02-01 NOTE — Telephone Encounter (Signed)
Please inform patient that his thyroid function, kidney function, electrolytes, white/red blood cells and cholesterol are all within the normal limits. Therefore no medications or adjustments other than what we discussed are needed at this time and he can plan to follow up in a year for an annual exam or sooner if needed.

## 2015-02-02 NOTE — Telephone Encounter (Signed)
LVM letting pt know that his results are normal.

## 2016-09-07 ENCOUNTER — Ambulatory Visit (INDEPENDENT_AMBULATORY_CARE_PROVIDER_SITE_OTHER): Payer: BLUE CROSS/BLUE SHIELD | Admitting: Family

## 2016-09-07 ENCOUNTER — Encounter: Payer: Self-pay | Admitting: Family

## 2016-09-07 VITALS — BP 138/92 | HR 91 | Temp 98.3°F | Resp 16 | Ht 64.0 in | Wt 183.0 lb

## 2016-09-07 DIAGNOSIS — M62838 Other muscle spasm: Secondary | ICD-10-CM | POA: Insufficient documentation

## 2016-09-07 DIAGNOSIS — G479 Sleep disorder, unspecified: Secondary | ICD-10-CM | POA: Diagnosis not present

## 2016-09-07 MED ORDER — TIZANIDINE HCL 4 MG PO TABS: 4.0000 mg | ORAL_TABLET | Freq: Four times a day (QID) | ORAL | 0 refills | Status: DC | PRN

## 2016-09-07 MED ORDER — DICLOFENAC SODIUM 2 % TD SOLN: 1.0000 "application " | Freq: Two times a day (BID) | TRANSDERMAL | 1 refills | Status: DC | PRN

## 2016-09-07 MED ORDER — IBUPROFEN-FAMOTIDINE 800-26.6 MG PO TABS: 1.0000 | ORAL_TABLET | Freq: Three times a day (TID) | ORAL | 1 refills | Status: DC | PRN

## 2016-09-07 NOTE — Patient Instructions (Signed)
Thank you for choosing [facility]!  Ice / moist heat x 20 minutes every 2 hours and as needed or following activity  Exercises 1-2 times per day as instructed.    Medications  Pennsaid - Approximately 1/2 packet to the affected site twice daily.  Duexis - 1 tablet 3 times per day for the next 5-7 days and then as needed.   You will receive a call from [facility] regarding your Pennsaid/Duexis/Vimovo. The medication will be mailed to you and should cost you no more than $10 per item or possibly free depending upon your insurance.   Your prescription(s) have been submitted to your pharmacy or been printed and provided for you. Please take as directed and contact our office if you believe you are having problem(s) with the medication(s) or have any questions.   If your symptoms worsen or fail to improve, please contact our office for further instruction, or in case of emergency go directly to the emergency room at the closest medical facility.    Cervical Strain and Sprain Rehab Ask your health care provider which exercises are safe for you. Do exercises exactly as told by your health care provider and adjust them as directed. It is normal to feel mild stretching, pulling, tightness, or discomfort as you do these exercises, but you should stop right away if you feel sudden pain or your pain gets worse.Do not begin these exercises until told by your health care provider. Stretching and range of motion exercises These exercises warm up your muscles and joints and improve the movement and flexibility of your neck. These exercises also help to relieve pain, numbness, and tingling. Exercise A: Cervical side bend   1. Using good posture, sit on a stable chair or stand up. 2. Without moving your shoulders, slowly tilt your left / right ear to your shoulder until you feel a stretch in your neck muscles. You should be looking straight ahead. 3. Hold for __________ seconds. 4. Repeat  with the other side of your neck. Repeat __________ times. Complete this exercise __________ times a day. Exercise B: Cervical rotation   1. Using good posture, sit on a stable chair or stand up. 2. Slowly turn your head to the side as if you are looking over your left / right shoulder.  Keep your eyes level with the ground.  Stop when you feel a stretch along the side and the back of your neck. 3. Hold for __________ seconds. 4. Repeat this by turning to your other side. Repeat __________ times. Complete this exercise __________ times a day. Exercise C: Thoracic extension and pectoral stretch  1. Roll a towel or a small blanket so it is about 4 inches (10 cm) in diameter. 2. Lie down on your back on a firm surface. 3. Put the towel lengthwise, under your spine in the middle of your back. It should not be not under your shoulder blades. The towel should line up with your spine from your middle back to your lower back. 4. Put your hands behind your head and let your elbows fall out to your sides. 5. Hold for __________ seconds. Repeat __________ times. Complete this exercise __________ times a day. Strengthening exercises These exercises build strength and endurance in your neck. Endurance is the ability to use your muscles for a long time, even after your muscles get tired. Exercise D: Upper cervical flexion, isometric  1. Lie on your back with a thin pillow behind your head and a small  rolled-up towel under your neck. 2. Gently tuck your chin toward your chest and nod your head down to look toward your feet. Do not lift your head off the pillow. 3. Hold for __________ seconds. 4. Release the tension slowly. Relax your neck muscles completely before you repeat this exercise. Repeat __________ times. Complete this exercise __________ times a day. Exercise E: Cervical extension, isometric   1. Stand about 6 inches (15 cm) away from a wall, with your back facing the wall. 2. Place a soft  object, about 6-8 inches (15-20 cm) in diameter, between the back of your head and the wall. A soft object could be a small pillow, a ball, or a folded towel. 3. Gently tilt your head back and press into the soft object. Keep your jaw and forehead relaxed. 4. Hold for __________ seconds. 5. Release the tension slowly. Relax your neck muscles completely before you repeat this exercise. Repeat __________ times. Complete this exercise __________ times a day. Posture and body mechanics   Body mechanics refers to the movements and positions of your body while you do your daily activities. Posture is part of body mechanics. Good posture and healthy body mechanics can help to relieve stress in your body's tissues and joints. Good posture means that your spine is in its natural S-curve position (your spine is neutral), your shoulders are pulled back slightly, and your head is not tipped forward. The following are general guidelines for applying improved posture and body mechanics to your everyday activities. Standing   When standing, keep your spine neutral and keep your feet about hip-width apart. Keep a slight bend in your knees. Your ears, shoulders, and hips should line up.  When you do a task in which you stand in one place for a long time, place one foot up on a stable object that is 2-4 inches (5-10 cm) high, such as a footstool. This helps keep your spine neutral. Sitting    When sitting, keep your spine neutral and your keep feet flat on the floor. Use a footrest, if necessary, and keep your thighs parallel to the floor. Avoid rounding your shoulders, and avoid tilting your head forward.  When working at a desk or a computer, keep your desk at a height where your hands are slightly lower than your elbows. Slide your chair under your desk so you are close enough to maintain good posture.  When working at a computer, place your monitor at a height where you are looking straight ahead and you do  not have to tilt your head forward or downward to look at the screen. Resting  When lying down and resting, avoid positions that are most painful for you. Try to support your neck in a neutral position. You can use a contour pillow or a small rolled-up towel. Your pillow should support your neck but not push on it. This information is not intended to replace advice given to you by your health care provider. Make sure you discuss any questions you have with your health care provider. Document Released: 05/02/2005 Document Revised: 01/07/2016 Document Reviewed: 04/08/2015 Elsevier Interactive Patient Education  2017 [employer].

## 2016-09-07 NOTE — Progress Notes (Signed)
Subjective:    Patient ID: Kyle Ortiz, male    DOB: May 23, 1980, 36 y.o.   MRN: [id number]  Chief Complaint  Patient presents with  . Shoulder Pain    right shoulder x3 weeks, sleep issues that he wanted to discuss    HPI:  Kyle Ortiz is a 36 y.o. male who  has a past medical history of Hypertension and Kidney stones. and presents today for an office visit.   1.) Right shoulder - This is a new problem. Associated symptom of pain located in his right shoulder has been going on for about 3 weeks. Describes that when he moves his neck he can feel the pain in his shoulder. Some pain in his shoulder. Pain is described as sharp on occasion. Does have some tingling on occasion that goes down to his proximal upper extremity. No trauma or injury that he can recall. Does work on cars. Modifying factors include exercises which have not helped very much. Right hand dominant. No fevers, chills, or weight loss.   2.) Sleep issues - Associated symptoms of sleep disturbance waking up in the middle of the night up to 2-3 times per night and generally takes about 20-30 minutes before being able to fall alseep. Able to fall asleep without problem. May snore at night on occasion with episodes of apnea. Modifying factors include a sleep aid which he has not started yet. No medication history or issues with sleep.    No Known Allergies    No outpatient prescriptions prior to visit.   No facility-administered medications prior to visit.       No past surgical history on file.    Past Medical History:  Diagnosis Date  . Hypertension   . Kidney stones       Review of Systems  Constitutional: Positive for fatigue. Negative for chills, fever and unexpected weight change.  Respiratory: Negative for chest tightness and shortness of breath.   Cardiovascular: Negative for chest pain, palpitations and leg swelling.  Musculoskeletal:       Positive for right shoulder pain.   Neurological:  Negative for weakness and numbness.  Psychiatric/Behavioral: Positive for sleep disturbance.      Objective:    BP (!) 138/92 (BP Location: Left Arm, Patient Position: Sitting, Cuff Size: Large)   Pulse 91   Temp 98.3 F (36.8 C) (Oral)   Resp 16   Ht  (1.626 m)   Wt 183 lb (83 kg)   SpO2 98%   BMI 31.41 kg/m  Nursing note and vital signs reviewed.  Physical Exam  Constitutional: He is oriented to person, place, and time. He appears well-developed and well-nourished. No distress.  Cardiovascular: Normal rate, regular rhythm, normal heart sounds and intact distal pulses.   Pulmonary/Chest: Effort normal and breath sounds normal.  Musculoskeletal:  Right shoulder/neck - no obvious deformity, discoloration, or edema. There is palpable tenderness of the upper trapezius with increased muscle tone consistent with spasm. Range of motion of the shoulder is normal. Decrease lateral bending and rotation of the neck. Strength is normal. Pulses and sensation are intact and appropriate. Negative Neer's impingement; negative Leanord Asal; negative empty can.   Neurological: He is alert and oriented to person, place, and time.  Skin: Skin is warm and dry.  Psychiatric: He has a normal mood and affect. His behavior is normal. Judgment and thought content normal.       Assessment & Plan:   Problem List Items Addressed This Visit  Other   Cervical paraspinal muscle spasm - Primary    Symptoms and exam are consistent with cervical paraspinal muscle spasms with radiculopathy to the shoulder. Treat conservatively with ice/moist heat, home exercise therapy, and start Duexis and Pennsaid. Start Zanaflex as needed for muscle spasms. If symptoms worsen or do not improve consider imaging and/or physical therapy.      Sleep disturbance    Patient declines medication at this time and will try over-the-counter medications. Encourage good sleep hygiene. Continue to monitor and follow-up if  symptoms worsen or do not improve.          I am having [name] start on Ibuprofen-Famotidine, Diclofenac Sodium, and tiZANidine.   Meds ordered this encounter  Medications  . Ibuprofen-Famotidine 800-26.6 MG TABS    Sig: Take 1 tablet by mouth 3 (three) times daily as needed.    Dispense:  90 tablet    Refill:  1    Order Specific Question:   Supervising Provider    Answer:   Hillard Danker A [[id number]]  . Diclofenac Sodium (PENNSAID) 2 % SOLN    Sig: Place 1 application onto the skin 2 (two) times daily as needed.    Dispense:  112 g    Refill:  1    Order Specific Question:   Supervising Provider    Answer:   Hillard Danker A [[id number]]  . tiZANidine (ZANAFLEX) 4 MG tablet    Sig: Take 1 tablet (4 mg total) by mouth every 6 (six) hours as needed for muscle spasms.    Dispense:  30 tablet    Refill:  0    Order Specific Question:   Supervising Provider    Answer:   Hillard Danker A [[id number]]     Follow-up: Return if symptoms worsen or fail to improve.  Jeanine Luz, FNP

## 2016-09-07 NOTE — Assessment & Plan Note (Addendum)
Patient declines medication at this time and will try over-the-counter medications. Encourage good sleep hygiene. Continue to monitor and follow-up if symptoms worsen or do not improve.

## 2016-09-07 NOTE — Assessment & Plan Note (Signed)
Symptoms and exam are consistent with cervical paraspinal muscle spasms with radiculopathy to the shoulder. Treat conservatively with ice/moist heat, home exercise therapy, and start Duexis and Pennsaid. Start Zanaflex as needed for muscle spasms. If symptoms worsen or do not improve consider imaging and/or physical therapy.

## 2016-09-22 ENCOUNTER — Other Ambulatory Visit (INDEPENDENT_AMBULATORY_CARE_PROVIDER_SITE_OTHER): Payer: BLUE CROSS/BLUE SHIELD

## 2016-09-22 ENCOUNTER — Ambulatory Visit (INDEPENDENT_AMBULATORY_CARE_PROVIDER_SITE_OTHER): Payer: BLUE CROSS/BLUE SHIELD | Admitting: Family

## 2016-09-22 ENCOUNTER — Encounter: Payer: Self-pay | Admitting: Family

## 2016-09-22 VITALS — BP 122/84 | HR 72 | Temp 98.6°F | Resp 16 | Ht 64.0 in | Wt 180.8 lb

## 2016-09-22 DIAGNOSIS — Z0000 Encounter for general adult medical examination without abnormal findings: Secondary | ICD-10-CM | POA: Diagnosis not present

## 2016-09-22 LAB — CBC
HCT: 46.4 % (ref 39.0–52.0)
Hemoglobin: 15.9 g/dL (ref 13.0–17.0)
MCHC: 34.2 g/dL (ref 30.0–36.0)
MCV: 86.2 fl (ref 78.0–100.0)
Platelets: 363 10*3/uL (ref 150.0–400.0)
RBC: 5.38 Mil/uL (ref 4.22–5.81)
RDW: 13.5 % (ref 11.5–15.5)
WBC: 11.2 10*3/uL — ABNORMAL HIGH (ref 4.0–10.5)

## 2016-09-22 LAB — LIPID PANEL
Cholesterol: 168 mg/dL (ref 0–200)
HDL: 47.6 mg/dL (ref >39.00)
LDL Cholesterol: 108 mg/dL — ABNORMAL HIGH (ref 0–99)
NonHDL: 120.74
Total CHOL/HDL Ratio: 4
Triglycerides: 64 mg/dL (ref 0.0–149.0)
VLDL: 12.8 mg/dL (ref 0.0–40.0)

## 2016-09-22 LAB — COMPREHENSIVE METABOLIC PANEL
ALT: 21 U/L (ref 0–53)
AST: 21 U/L (ref 0–37)
Albumin: 4.8 g/dL (ref 3.5–5.2)
Alkaline Phosphatase: 53 U/L (ref 39–117)
BUN: 17 mg/dL (ref 6–23)
CO2: 31 mEq/L (ref 19–32)
Calcium: 10 mg/dL (ref 8.4–10.5)
Chloride: 103 mEq/L (ref 96–112)
Creatinine, Ser: 0.95 mg/dL (ref 0.40–1.50)
GFR: 94.76 mL/min (ref >60.00)
Glucose, Bld: 105 mg/dL — ABNORMAL HIGH (ref 70–99)
Potassium: 4.7 mEq/L (ref 3.5–5.1)
Sodium: 140 mEq/L (ref 135–145)
Total Bilirubin: 0.6 mg/dL (ref 0.2–1.2)
Total Protein: 7.5 g/dL (ref 6.0–8.3)

## 2016-09-22 NOTE — Patient Instructions (Signed)
Thank you for choosing [facility].  SUMMARY AND INSTRUCTIONS:  Labs:  Please stop by the lab on the lower level of the building for your blood work. Your results will be released to MyChart (or called to you) after review, usually within 72 hours after test completion. If any changes need to be made, you will be notified at that same time.  1.) The lab is open from 7:30am to 5:30 pm Monday-Friday 2.) No appointment is necessary 3.) Fasting (if needed) is 6-8 hours after food and drink; black coffee and water are okay   Follow up:  If your symptoms worsen or fail to improve, please contact our office for further instruction, or in case of emergency go directly to the emergency room at the closest medical facility.    Health Maintenance, Male A healthy lifestyle and preventive care is important for your health and wellness. Ask your health care provider about what schedule of regular examinations is right for you. What should I know about weight and diet?  Eat a Healthy Diet  Eat plenty of vegetables, fruits, whole grains, low-fat dairy products, and lean protein.  Do not eat a lot of foods high in solid fats, added sugars, or salt. Maintain a Healthy Weight  Regular exercise can help you achieve or maintain a healthy weight. You should:  Do at least 150 minutes of exercise each week. The exercise should increase your heart rate and make you sweat (moderate-intensity exercise).  Do strength-training exercises at least twice a week. Watch Your Levels of Cholesterol and Blood Lipids  Have your blood tested for lipids and cholesterol every 5 years starting at 37 years of age. If you are at high risk for heart disease, you should start having your blood tested when you are 37 years old. You may need to have your cholesterol levels checked more often if:  Your lipid or cholesterol levels are high.  You are older than 37 years of age.  You are at high risk for heart  disease. What should I know about cancer screening? Many types of cancers can be detected early and may often be prevented. Lung Cancer  You should be screened every year for lung cancer if:  You are a current smoker who has smoked for at least 30 years.  You are a former smoker who has quit within the past 15 years.  Talk to your health care provider about your screening options, when you should start screening, and how often you should be screened. Colorectal Cancer  Routine colorectal cancer screening usually begins at 37 years of age and should be repeated every 5-10 years until you are 37 years old. You may need to be screened more often if early forms of precancerous polyps or small growths are found. Your health care provider may recommend screening at an earlier age if you have risk factors for colon cancer.  Your health care provider may recommend using home test kits to check for hidden blood in the stool.  A small camera at the end of a tube can be used to examine your colon (sigmoidoscopy or colonoscopy). This checks for the earliest forms of colorectal cancer. Prostate and Testicular Cancer  Depending on your age and overall health, your health care provider may do certain tests to screen for prostate and testicular cancer.  Talk to your health care provider about any symptoms or concerns you have about testicular or prostate cancer. Skin Cancer  Check your skin from head to   toe regularly.  Tell your health care provider about any new moles or changes in moles, especially if:  There is a change in a mole's size, shape, or color.  You have a mole that is larger than a pencil eraser.  Always use sunscreen. Apply sunscreen liberally and repeat throughout the day.  Protect yourself by wearing long sleeves, pants, a wide-brimmed hat, and sunglasses when outside. What should I know about heart disease, diabetes, and high blood pressure?  If you are 18-39 years of age,  have your blood pressure checked every 3-5 years. If you are 40 years of age or older, have your blood pressure checked every year. You should have your blood pressure measured twice-once when you are at a hospital or clinic, and once when you are not at a hospital or clinic. Record the average of the two measurements. To check your blood pressure when you are not at a hospital or clinic, you can use:  An automated blood pressure machine at a pharmacy.  A home blood pressure monitor.  Talk to your health care provider about your target blood pressure.  If you are between 45-79 years old, ask your health care provider if you should take aspirin to prevent heart disease.  Have regular diabetes screenings by checking your fasting blood sugar level.  If you are at a normal weight and have a low risk for diabetes, have this test once every three years after the age of 45.  If you are overweight and have a high risk for diabetes, consider being tested at a younger age or more often.  A one-time screening for abdominal aortic aneurysm (AAA) by ultrasound is recommended for men aged 65-75 years who are current or former smokers. What should I know about preventing infection? Hepatitis B  If you have a higher risk for hepatitis B, you should be screened for this virus. Talk with your health care provider to find out if you are at risk for hepatitis B infection. Hepatitis C  Blood testing is recommended for:  Everyone born from 1945 through 1965.  Anyone with known risk factors for hepatitis C. Sexually Transmitted Diseases (STDs)  You should be screened each year for STDs including gonorrhea and chlamydia if:  You are sexually active and are younger than 37 years of age.  You are older than 37 years of age and your health care provider tells you that you are at risk for this type of infection.  Your sexual activity has changed since you were last screened and you are at an increased risk for  chlamydia or gonorrhea. Ask your health care provider if you are at risk.  Talk with your health care provider about whether you are at high risk of being infected with HIV. Your health care provider may recommend a prescription medicine to help prevent HIV infection. What else can I do?  Schedule regular health, dental, and eye exams.  Stay current with your vaccines (immunizations).  Do not use any tobacco products, such as cigarettes, chewing tobacco, and e-cigarettes. If you need help quitting, ask your health care provider.  Limit alcohol intake to no more than 2 drinks per day. One drink equals 12 ounces of beer, 5 ounces of wine, or 1 ounces of hard liquor.  Do not use street drugs.  Do not share needles.  Ask your health care provider for help if you need support or information about quitting drugs.  Tell your health care provider if you often   feel depressed.  Tell your health care provider if you have ever been abused or do not feel safe at home. This information is not intended to replace advice given to you by your health care provider. Make sure you discuss any questions you have with your health care provider. Document Released: 10/29/2007 Document Revised: 12/30/2015 Document Reviewed: 02/03/2015 Elsevier Interactive Patient Education  2017 [employer]   Cholesterol Cholesterol is a fat. Your body needs a small amount of cholesterol. Cholesterol (plaque) may build up in your blood vessels (arteries). That makes you more likely to have a heart attack or stroke. You cannot feel your cholesterol level. Having a blood test is the only way to find out if your level is high. Keep your test results. Work with your doctor to keep your cholesterol at a good level. What do the results mean?  Total cholesterol is how much cholesterol is in your blood.  LDL is bad cholesterol. This is the type that can build up. Try to have low LDL.  HDL is good cholesterol. It cleans your  blood vessels and carries LDL away. Try to have high HDL.  Triglycerides are fat that the body can store or burn for energy. What are good levels of cholesterol?  Total cholesterol below 200.  LDL below 100 is good for people who have health risks. LDL below 70 is good for people who have very high risks.  HDL above 40 is good. It is best to have HDL of 60 or higher.  Triglycerides below 150. How can I lower my cholesterol? Diet  Follow your diet program as told by your doctor.  Choose fish, white meat chicken, or [place] that is roasted or baked. Try not to eat red meat, fried foods, sausage, or lunch meats.  Eat lots of fresh fruits and vegetables.  Choose whole grains, beans, pasta, potatoes, and cereals.  Choose olive oil, corn oil, or canola oil. Only use small amounts.  Try not to eat butter, mayonnaise, shortening, or palm kernel oils.  Try not to eat foods with trans fats.  Choose low-fat or nonfat dairy foods.  Drink skim or nonfat milk.  Eat low-fat or nonfat yogurt and cheeses.  Try not to drink whole milk or cream.  Try not to eat ice cream, egg yolks, or full-fat cheeses.  Healthy desserts include angel food cake, ginger snaps, animal crackers, hard candy, popsicles, and low-fat or nonfat frozen yogurt. Try not to eat pastries, cakes, pies, and cookies. Exercise  Follow your exercise program as told by your doctor.  Be more active. Try gardening, walking, and taking the stairs.  Ask your doctor about ways that you can be more active. Medicine  Take over-the-counter and prescription medicines only as told by your doctor. This information is not intended to replace advice given to you by your health care provider. Make sure you discuss any questions you have with your health care provider. Document Released: 07/29/2008 Document Revised: 12/02/2015 Document Reviewed: 11/12/2015 Elsevier Interactive Patient Education  2017 [employer]

## 2016-09-22 NOTE — Assessment & Plan Note (Addendum)
1) Anticipatory Guidance: Discussed importance of wearing a seatbelt while driving and not texting while driving; changing batteries in smoke detector at least once annually; wearing suntan lotion when outside; eating a balanced and moderate diet; getting physical activity at least 30 minutes per day.  2) Immunizations / Screenings / Labs:  All immunizations are up-to-date per recommendations. Due for a vision exam encouraged to be completed independently. All other screenings are up-to-date per recommendations. Obtain CBC, CMET, and lipid profile.    Overall well exam with risk factors for cardiovascular disease being minimal at this time. Encouraged increase physical activity to 30 minutes of moderate level activity daily or approximately 10,000 steps per day. BMI indicates obesity, however patient is very muscular and most likely below 20% body fat. Continue other healthy lifestyle behaviors and choices. Follow-up prevention exam in 1 year. Follow-up office visit pending blood work as needed.

## 2016-09-22 NOTE — Progress Notes (Signed)
Subjective:    Patient ID: Kyle Ortiz, male    DOB: May 04, 1981, 36 y.o.   MRN: [id number]  Chief Complaint  Patient presents with  . CPE    fasting    HPI:  Kyle Ortiz is a 36 y.o. male who presents today for an annual wellness visit.   1) Health Maintenance -   Diet - Averages about 3-4 meals per day consisting of a regular diet; Working on moderation and balance; Caffeine intake 2-3 cups per day  Exercise - Occasional and inconsistent   2) Preventative Exams / Immunizations:  Dental -- Up to date  Vision -- Due for exam    Health Maintenance  Topic Date Due  . HIV Screening  08/13/1994  . INFLUENZA VACCINE  12/14/2016  . TETANUS/TDAP  01/28/2025    Immunization History  Administered Date(s) Administered  . Influenza,inj,Quad PF,36+ Mos 01/29/2015  . Tdap 01/29/2015     No Known Allergies   Outpatient Medications Prior to Visit  Medication Sig Dispense Refill  . Ibuprofen-Famotidine 800-26.6 MG TABS Take 1 tablet by mouth 3 (three) times daily as needed. 90 tablet 1  . tiZANidine (ZANAFLEX) 4 MG tablet Take 1 tablet (4 mg total) by mouth every 6 (six) hours as needed for muscle spasms. 30 tablet 0  . Diclofenac Sodium (PENNSAID) 2 % SOLN Place 1 application onto the skin 2 (two) times daily as needed. 112 g 1   No facility-administered medications prior to visit.      Past Medical History:  Diagnosis Date  . Hypertension   . Kidney stones      History reviewed. No pertinent surgical history.   Family History  Problem Relation Age of Onset  . Healthy Mother   . Healthy Father   . Cancer Neg Hx   . Diabetes Neg Hx   . Heart failure Neg Hx   . Hyperlipidemia Neg Hx      Social History   Social History  . Marital status: Married    Spouse name: N/A  . Number of children: 1  . Years of education: 71   Occupational History  . Technician     Social History Main Topics  . Smoking status: Never Smoker  . Smokeless tobacco: Never  Used  . Alcohol use Yes     Comment: Occasional  . Drug use: Yes    Frequency: 5.0 times per week    Types: Marijuana  . Sexual activity: Not on file   Other Topics Concern  . Not on file   Social History Narrative   Currently resides with his wife and child. Fun: Going to gym; travel, going out to eat   Denies religious beliefs effecting health care.       Review of Systems  Constitutional: Denies fever, chills, fatigue, or significant weight gain/loss. HENT: Head: Denies headache or neck pain Ears: Denies changes in hearing, ringing in ears, earache, drainage Nose: Denies discharge, stuffiness, itching, nosebleed, sinus pain Throat: Denies sore throat, hoarseness, dry mouth, sores, thrush Eyes: Denies loss/changes in vision, pain, redness, blurry/double vision, flashing lights Cardiovascular: Denies chest pain/discomfort, tightness, palpitations, shortness of breath with activity, difficulty lying down, swelling, sudden awakening with shortness of breath Respiratory: Denies shortness of breath, cough, sputum production, wheezing Gastrointestinal: Denies dysphasia, heartburn, change in appetite, nausea, change in bowel habits, rectal bleeding, constipation, diarrhea, yellow skin or eyes Genitourinary: Denies frequency, urgency, burning/pain, blood in urine, incontinence, change in urinary strength. Musculoskeletal: Denies muscle/joint pain, stiffness, back  pain, redness or swelling of joints, trauma Skin: Denies rashes, lumps, itching, dryness, color changes, or hair/nail changes Neurological: Denies dizziness, fainting, seizures, weakness, numbness, tingling, tremor Psychiatric - Denies nervousness, stress, depression or memory loss Endocrine: Denies heat or cold intolerance, sweating, frequent urination, excessive thirst, changes in appetite Hematologic: Denies ease of bruising or bleeding     Objective:     BP 122/84 (BP Location: Left Arm, Patient Position: Sitting, Cuff  Size: Large)   Pulse 72   Temp 98.6 F (37 C) (Oral)   Resp 16   Ht 5\' 4"  (1.626 m)   Wt 180 lb 12.8 oz (82 kg)   SpO2 98%   BMI 31.03 kg/m  Nursing note and vital signs reviewed.  Physical Exam  Constitutional: He is oriented to person, place, and time. He appears well-developed and well-nourished.  HENT:  Head: Normocephalic.  Right Ear: Hearing, tympanic membrane, external ear and ear canal normal.  Left Ear: Hearing, tympanic membrane, external ear and ear canal normal.  Nose: Nose normal.  Mouth/Throat: Uvula is midline, oropharynx is clear and moist and mucous membranes are normal.  Eyes: Conjunctivae and EOM are normal. Pupils are equal, round, and reactive to light.  Neck: Neck supple. No JVD present. No tracheal deviation present. No thyromegaly present.  Cardiovascular: Normal rate, regular rhythm, normal heart sounds and intact distal pulses.   Pulmonary/Chest: Effort normal and breath sounds normal.  Abdominal: Soft. Bowel sounds are normal. He exhibits no distension and no mass. There is no tenderness. There is no rebound and no guarding.  Musculoskeletal: Normal range of motion. He exhibits no edema or tenderness.  Lymphadenopathy:    He has no cervical adenopathy.  Neurological: He is alert and oriented to person, place, and time. He has normal reflexes. No cranial nerve deficit. He exhibits normal muscle tone. Coordination normal.  Skin: Skin is warm and dry.  Psychiatric: He has a normal mood and affect. His behavior is normal. Judgment and thought content normal.       Assessment & Plan:   Problem List Items Addressed This Visit      Other   Routine general medical examination at a health care facility - Primary    1) Anticipatory Guidance: Discussed importance of wearing a seatbelt while driving and not texting while driving; changing batteries in smoke detector at least once annually; wearing suntan lotion when outside; eating a balanced and moderate diet;  getting physical activity at least 30 minutes per day.  2) Immunizations / Screenings / Labs:  All immunizations are up-to-date per recommendations. Due for a vision exam encouraged to be completed independently. All other screenings are up-to-date per recommendations. Obtain CBC, CMET, and lipid profile.    Overall well exam with risk factors for cardiovascular disease being minimal at this time. Encouraged increase physical activity to 30 minutes of moderate level activity daily or approximately 10,000 steps per day. BMI indicates obesity, however patient is very muscular and most likely below 20% body fat. Continue other healthy lifestyle behaviors and choices. Follow-up prevention exam in 1 year. Follow-up office visit pending blood work as needed.       Relevant Orders   CBC   Comprehensive metabolic panel   Lipid panel       I have discontinued Mr. Gosline's Diclofenac Sodium. I am also having him maintain his Ibuprofen-Famotidine and tiZANidine.   Follow-up: Return in about 1 year (around 09/22/2017), or if symptoms worsen or fail to improve.   Bess Saltzman,  Earl Lites, FNP

## 2017-10-10 ENCOUNTER — Ambulatory Visit (INDEPENDENT_AMBULATORY_CARE_PROVIDER_SITE_OTHER): Payer: BLUE CROSS/BLUE SHIELD

## 2017-10-10 ENCOUNTER — Ambulatory Visit (HOSPITAL_COMMUNITY)
Admission: EM | Admit: 2017-10-10 | Discharge: 2017-10-10 | Disposition: A | Discharge status: Home / Self Care | Payer: BLUE CROSS/BLUE SHIELD | Attending: Family Medicine | Admitting: Family Medicine

## 2017-10-10 ENCOUNTER — Encounter (HOSPITAL_COMMUNITY): Payer: Self-pay | Admitting: Emergency Medicine

## 2017-10-10 ENCOUNTER — Other Ambulatory Visit: Payer: Self-pay

## 2017-10-10 DIAGNOSIS — M25571 Pain in right ankle and joints of right foot: Secondary | ICD-10-CM | POA: Diagnosis not present

## 2017-10-10 MED ORDER — MELOXICAM 7.5 MG PO TABS: 7.5000 mg | ORAL_TABLET | Freq: Every day | ORAL | 0 refills | Status: DC

## 2017-10-10 NOTE — Discharge Instructions (Signed)
X-ray was negative for fracture or dislocation.  Start Mobic as directed.  Ice compress, elevation, ankle brace during activity.  You can continue to use crutches as needed.  Follow-up with PCP for further evaluation if symptoms not improving.  If experiencing numbness and tingling of your toes, discoloration, follow-up for reevaluation.

## 2017-10-10 NOTE — ED Triage Notes (Signed)
The patient presented to the [facility] with a complaint of right ankle pain after a 3 foot fall of a ladder yesterday. The patient denied any LOC.

## 2017-10-10 NOTE — ED Provider Notes (Signed)
[facility]    CSN: [id number] Arrival date & time: 10/10/17  1009     History   Chief Complaint Chief Complaint  Patient presents with  . Fall    HPI Kyle Ortiz is a 37 y.o. male.   37 year old male comes in for right ankle pain after fall yesterday.  States he was on a ladder about 3 feet up, was power washing the house when he fell, states he landed on his right ankle.  Denies head injury, loss of consciousness.  States had painful weightbearing immediately after the incident.  He is applied ice compress, and ibuprofen with some relief of the pain.  States swelling slightly decreased with ice compress.  Denies numbness, tingling.  Denies radiation of pain.     Past Medical History:  Diagnosis Date  . Hypertension   . Kidney stones     Patient Active Problem List   Diagnosis Date Noted  . Cervical paraspinal muscle spasm 09/07/2016  . Sleep disturbance 09/07/2016  . Routine general medical examination at a health care facility 01/29/2015  . Tooth infection 09/17/2014  . Rash and nonspecific skin eruption 09/17/2014    History reviewed. No pertinent surgical history.     Home Medications    Prior to Admission medications   Medication Sig Start Date End Date Taking? Authorizing Provider  meloxicam (MOBIC) 7.5 MG tablet Take 1 tablet (7.5 mg total) by mouth daily. 10/10/17   Belinda Fisher, PA-C    Family History Family History  Problem Relation Age of Onset  . Healthy Mother   . Healthy Father   . Cancer Neg Hx   . Diabetes Neg Hx   . Heart failure Neg Hx   . Hyperlipidemia Neg Hx     Social History Social History   Tobacco Use  . Smoking status: Never Smoker  . Smokeless tobacco: Never Used  Substance Use Topics  . Alcohol use: Yes    Comment: Occasional  . Drug use: Yes    Frequency: 5.0 times per week    Types: Marijuana     Allergies   Patient has no known allergies.   Review of Systems Review of Systems  Reason unable to  perform ROS: See HPI as above.     Physical Exam Triage Vital Signs ED Triage Vitals  Enc Vitals Group     BP 10/10/17 1052 133/79     Pulse Rate 10/10/17 1052 66     Resp 10/10/17 1052 18     Temp 10/10/17 1052 98.4 F (36.9 C)     Temp Source 10/10/17 1052 Oral     SpO2 10/10/17 1052 100 %     Weight --      Height --      Head Circumference --      Peak Flow --      Pain Score 10/10/17 1053 8     Pain Loc --      Pain Edu? --      Excl. in GC? --    No data found.  Updated Vital Signs BP 133/79 (BP Location: Left Arm)   Pulse 66   Temp 98.4 F (36.9 C) (Oral)   Resp 18   SpO2 100%   Physical Exam  Constitutional: He is oriented to person, place, and time. He appears well-developed and well-nourished. No distress.  HENT:  Head: Normocephalic and atraumatic.  Eyes: Pupils are equal, round, and reactive to light. Conjunctivae are normal.  Musculoskeletal:  Swelling with contusion to the right lateral ankle.  No erythema or increased warmth.  Mild tenderness to palpation of lateral ankle. No tenderness on palpation along right lower extremity, right knee. Full range of motion of ankle and knee.  Strength normal and equal bilaterally.  Sensation intact and equal bilaterally.  Pedal pulse 2+ and equal bilaterally.   Neurological: He is alert and oriented to person, place, and time.     UC Treatments / Results  Labs (all labs ordered are listed, but only abnormal results are displayed) Labs Reviewed - No data to display  EKG None  Radiology Dg Ankle Complete Right  Result Date: 10/10/2017 CLINICAL DATA:  Larey Seat from a ladder yesterday injuring the right ankle. Persistent pain with cutaneous bruising laterally. EXAM: RIGHT ANKLE - COMPLETE 3+ VIEW COMPARISON:  None in PACs FINDINGS: The bones are subjectively adequately mineralized. The ankle joint mortise is preserved. The talar dome is intact. There is a small spur arising from the tip of the medial malleolus. The  talus and calcaneus are intact. There is soft tissue swelling anteriorly and laterally. The metatarsal bases are intact where visualized. IMPRESSION: No acute fracture nor dislocation. Minimal degenerative change. Self tissue swelling anteriorly and laterally. Electronically Signed   By: David  [place] M.D.   On: 10/10/2017 11:10    Procedures Procedures (including critical care time)  Medications Ordered in UC Medications - No data to display  Initial Impression / Assessment and Plan / UC Course  I have reviewed the triage vital signs and the nursing notes.  Pertinent labs & imaging results that were available during my care of the patient were reviewed by me and considered in my medical decision making (see chart for details).    X-ray negative for fracture dislocation.  Patient to start Mobic, ice compress, elevation, ankle brace during activity.  He can continue with crutches as needed.  Return precautions given.  Patient expresses understanding and agrees to plan.  Final Clinical Impressions(s) / UC Diagnoses   Final diagnoses:  Acute right ankle pain    ED Prescriptions    Medication Sig Dispense Auth. Provider   meloxicam (MOBIC) 7.5 MG tablet Take 1 tablet (7.5 mg total) by mouth daily. 15 tablet Threasa Alpha, [place] 10/10/17 1201

## 2018-02-13 ENCOUNTER — Encounter (HOSPITAL_COMMUNITY): Payer: Self-pay | Admitting: *Deleted

## 2018-02-13 ENCOUNTER — Ambulatory Visit (HOSPITAL_COMMUNITY)
Admission: EM | Admit: 2018-02-13 | Discharge: 2018-02-13 | Disposition: A | Discharge status: Home / Self Care | Payer: BLUE CROSS/BLUE SHIELD | Attending: Family Medicine | Admitting: Family Medicine

## 2018-02-13 ENCOUNTER — Other Ambulatory Visit: Payer: Self-pay

## 2018-02-13 DIAGNOSIS — T7840XA Allergy, unspecified, initial encounter: Secondary | ICD-10-CM

## 2018-02-13 DIAGNOSIS — S0096XA Insect bite (nonvenomous) of unspecified part of head, initial encounter: Secondary | ICD-10-CM

## 2018-02-13 DIAGNOSIS — T7849XA Other allergy, initial encounter: Secondary | ICD-10-CM | POA: Diagnosis not present

## 2018-02-13 DIAGNOSIS — T63441A Toxic effect of venom of bees, accidental (unintentional), initial encounter: Secondary | ICD-10-CM

## 2018-02-13 NOTE — ED Provider Notes (Signed)
[facility]    CSN: [id number] Arrival date & time: 02/13/18  4098     History   Chief Complaint Chief Complaint  Patient presents with  . Insect Bite    HPI Dominique Calvey is a 37 y.o. male.   HPI  Patient was outdoors at his home this morning and was stung by bee on his right cheekbone area.  The right side of his face is swollen.  Lower part of his eye is swollen.  Moderately painful.  He was unable to work.  He is here for evaluation.  He took a Zyrtec.  He is used some ice.  He has no known allergy to bee's.  No difficulty speaking or swallowing.  No shortness of breath.  History reviewed. No pertinent past medical history.  Patient Active Problem List   Diagnosis Date Noted  . Cervical paraspinal muscle spasm 09/07/2016  . Sleep disturbance 09/07/2016  . Routine general medical examination at a health care facility 01/29/2015  . Tooth infection 09/17/2014  . Rash and nonspecific skin eruption 09/17/2014    History reviewed. No pertinent surgical history.     Home Medications    Prior to Admission medications   Medication Sig Start Date End Date Taking? Authorizing Provider  meloxicam (MOBIC) 7.5 MG tablet Take 1 tablet (7.5 mg total) by mouth daily. 10/10/17   Belinda Fisher, PA-C    Family History Family History  Problem Relation Age of Onset  . Healthy Mother   . Healthy Father   . Cancer Neg Hx   . Diabetes Neg Hx   . Heart failure Neg Hx   . Hyperlipidemia Neg Hx     Social History Social History   Tobacco Use  . Smoking status: Current Some Day Smoker  . Smokeless tobacco: Never Used  Substance Use Topics  . Alcohol use: Yes    Comment: Occasional  . Drug use: Not Currently    Frequency: 5.0 times per week    Types: Marijuana     Allergies   Patient has no known allergies.   Review of Systems Review of Systems  Constitutional: Negative for chills and fever.  HENT: Negative for ear pain and sore throat.   Eyes: Negative for  pain and visual disturbance.  Respiratory: Negative for cough and shortness of breath.   Cardiovascular: Negative for chest pain and palpitations.  Gastrointestinal: Negative for abdominal pain and vomiting.  Genitourinary: Negative for dysuria and hematuria.  Musculoskeletal: Negative for arthralgias and back pain.  Skin: Negative for color change and rash.  Neurological: Positive for facial asymmetry. Negative for seizures and syncope.       Swelling right cheek  All other systems reviewed and are negative.    Physical Exam Triage Vital Signs ED Triage Vitals  Enc Vitals Group     BP 02/13/18 1019 131/82     Pulse Rate 02/13/18 1019 69     Resp 02/13/18 1019 16     Temp 02/13/18 1019 98.2 F (36.8 C)     Temp Source 02/13/18 1019 Oral     SpO2 02/13/18 1019 100 %     Weight --      Height --      Head Circumference --      Peak Flow --      Pain Score 02/13/18 1022 0     Pain Loc --      Pain Edu? --      Excl. in GC? --  No data found.  Updated Vital Signs BP 131/82 (BP Location: Right Arm)   Pulse 69   Temp 98.2 F (36.8 C) (Oral)   Resp 16   SpO2 100%       Physical Exam  Constitutional: He appears well-developed and well-nourished. No distress.  HENT:  Head: Normocephalic and atraumatic.    Right Ear: External ear normal.  Left Ear: External ear normal.  Mouth/Throat: Oropharynx is clear and moist.  Eyes: Pupils are equal, round, and reactive to light. Conjunctivae and EOM are normal.  Neck: Normal range of motion.  Cardiovascular: Normal rate.  Pulmonary/Chest: Effort normal. No respiratory distress.  Abdominal: Soft. He exhibits no distension.  Musculoskeletal: Normal range of motion. He exhibits no edema.  Neurological: He is alert.  Skin: Skin is warm and dry.     UC Treatments / Results  Labs (all labs ordered are listed, but only abnormal results are displayed) Labs Reviewed - No data to display  EKG None  Radiology No results  found.  Procedures Procedures (including critical care time)  Medications Ordered in UC Medications - No data to display  Initial Impression / Assessment and Plan / UC Course  I have reviewed the triage vital signs and the nursing notes.  Pertinent labs & imaging results that were available during my care of the patient were reviewed by me and considered in my medical decision making (see chart for details).     Discussed home management of insect bites.  Ice.  Benadryl.  Offered prednisone.  Patient declines.  Offered work note.  Patient declines. Final Clinical Impressions(s) / UC Diagnoses   Final diagnoses:  Bee sting, accidental or unintentional, initial encounter  Allergic reaction, initial encounter     Discharge Instructions     Ice to area 20 min off and on today Benadryl for the allergy Expect improvement over a couple of days   ED Prescriptions    None     Controlled Substance Prescriptions [place] Controlled Substance Registry consulted? Not Applicable   Eustace Moore, MD 02/13/18 1102

## 2018-02-13 NOTE — Discharge Instructions (Signed)
Ice to area 20 min off and on today Benadryl for the allergy Expect improvement over a couple of days

## 2018-02-13 NOTE — ED Triage Notes (Signed)
States he was stung by a bee this am right cheek bone

## 2019-07-15 ENCOUNTER — Ambulatory Visit: Payer: BC Managed Care – PPO | Admitting: Family Medicine

## 2019-07-15 ENCOUNTER — Other Ambulatory Visit: Payer: Self-pay

## 2019-07-15 ENCOUNTER — Telehealth: Payer: Self-pay | Admitting: Internal Medicine

## 2019-07-15 ENCOUNTER — Encounter: Payer: Self-pay | Admitting: Family Medicine

## 2019-07-15 VITALS — BP 120/78 | HR 83 | Temp 97.3°F | Ht 67.5 in | Wt 185.2 lb

## 2019-07-15 DIAGNOSIS — L298 Other pruritus: Secondary | ICD-10-CM | POA: Diagnosis not present

## 2019-07-15 DIAGNOSIS — R21 Rash and other nonspecific skin eruption: Secondary | ICD-10-CM | POA: Diagnosis not present

## 2019-07-15 DIAGNOSIS — F1520 Other stimulant dependence, uncomplicated: Secondary | ICD-10-CM | POA: Diagnosis not present

## 2019-07-15 DIAGNOSIS — G479 Sleep disorder, unspecified: Secondary | ICD-10-CM

## 2019-07-15 DIAGNOSIS — L2989 Other pruritus: Secondary | ICD-10-CM

## 2019-07-15 NOTE — Telephone Encounter (Signed)
Pt called back and states that his cream is Fougera Triamcinolon acetonide ointment 0.01% 1-2 Application daily

## 2019-07-15 NOTE — Patient Instructions (Signed)
Call back and let me know what the name of the cream is that you have been using.   Avoid topical steroids for now.   Try using over the counter Lamisil cream to the area twice daily for the next 4 weeks.  Keep the area dry and clean. Wear loose fitting underwear especially at night.   Let me know if this is not improving or if you are getting worse.

## 2019-07-15 NOTE — Telephone Encounter (Signed)
Pt was notified.  

## 2019-07-15 NOTE — Progress Notes (Signed)
Subjective:    Patient ID: Kyle Ortiz, male    DOB: 1980/08/25, 39 y.o.   MRN: [id number]  HPI Chief Complaint  Patient presents with  . new pt    new pt, itching in thighs and around testicles, problems sleeping- waking up multiple times a night   He is new to the practice and here to establish care. Previous medical care: Dr. Carver Fila. No care in a couple of years.   Complains of a 2 year history of an intermittent pruritic rash in his bilateral groin and testicles. States the rash is not as flared up today. States it gets worse when hot outside or when he sweats a lot. States itching is worse at night when he goes to bed. Keeps him awake. He wears compression   States he saw a dermatologist 2 years ago for this and has been using cream that was prescribed. He was also prescribed a powder which he did not ever get.   Reports a different type of pruritic rash on the back of his upper left thigh. This has been intermittent for the past year. Is not using anything on this.    Complains of having difficulty sleeping. No trouble falling asleep. Wakes up 2 times per night for about an hour at a time.  Drinks coffee at night and sometimes in the middle of the night.  Drinks several cups of coffee per day. States he puts a lot of sugar and whole milk in it.  States he has been taking melatonin.  Does not drink much water in the winter.   Smokes marijuana faily.   Married and 1 child. Works as a Psychiatrist   2 older brothers.  Mother died of AIDS. He has been tested  Declines HIV testing today  Denies fever, chills, night sweats, weight loss, fatigue,  dizziness, chest pain, palpitations, shortness of breath, abdominal pain, N/V/D, urinary symptoms, LE edema.  Denies polydipsia or polyuria.   Reviewed allergies, medications, past medical, surgical, family, and social history.     Review of Systems Pertinent positives and negatives in the history of present illness.       Objective:   Physical Exam BP 120/78   Pulse 83   Temp (!) 97.3 F (36.3 C)   Ht 5' 7.5" (1.715 m)   Wt 185 lb 3.2 oz (84 kg)   BMI 28.58 kg/m    Alert and oriented and in no acute distress. Neck is supple without adenopathy or thyromegaly. Cardiac exam shows a regular sinus rhythm without murmurs or gallops. Lungs are clear to auscultation. Skin is warm and dry. He has 3-4 discreet red, raised bumps on scrotum without any satellite lesions. No vesicles. Skin is normal on penis and in groin at this time. Patch of red, raised bumps on the posterior of his left upper leg.       Assessment & Plan:  Chronic pruritic rash in adult - Plan: CBC with Differential/Platelet, Comprehensive metabolic panel, Hemoglobin A1c  Rash of groin - Plan: CBC with Differential/Platelet, Comprehensive metabolic panel  Sleep disturbance  Caffeine dependence (HCC)  He is new to the practice and here to establish care.  States rash in his groin is not flared up today so it is difficult to describe what usually looks like.  He will call back to let me know what the dermatologist prescribed for him to use 2 years ago he is not sure if he is using an antifungal or steroid cream.  Advised him to avoid steroid creams and to try over-the-counter Lamisil twice daily for the next 4 weeks.  He may try a topical steroid to the rash on the back of his left leg.  Advised him to follow-up if his symptoms are getting worse. Counseling on good sleep hygiene and cutting back on caffeine and sugar particularly before bed and when he wakes up in the middle the night.  Avoid caffeine after lunch.  States he has a craving for coffee but he thinks it is due to the sugar he puts in it.  He may continue using melatonin for sleep.  I will screen for diabetes.  Check labs and follow-up.  If he is still having difficulty sleeping, we will need to address this again.

## 2019-07-15 NOTE — Telephone Encounter (Signed)
Have him stop using that cream and use the Lamisil as we discussed

## 2019-07-16 LAB — HEMOGLOBIN A1C
Est. average glucose Bld gHb Est-mCnc: 108 mg/dL
Hgb A1c MFr Bld: 5.4 % (ref 4.8–5.6)

## 2019-07-16 LAB — CBC WITH DIFFERENTIAL/PLATELET
Basophils Absolute: 0.1 10*3/uL (ref 0.0–0.2)
Basos: 2 %
EOS (ABSOLUTE): 0.3 10*3/uL (ref 0.0–0.4)
Eos: 3 %
Hematocrit: 45.6 % (ref 37.5–51.0)
Hemoglobin: 15.5 g/dL (ref 13.0–17.7)
Immature Grans (Abs): 0 10*3/uL (ref 0.0–0.1)
Immature Granulocytes: 0 %
Lymphocytes Absolute: 3 10*3/uL (ref 0.7–3.1)
Lymphs: 39 %
MCH: 29 pg (ref 26.6–33.0)
MCHC: 34 g/dL (ref 31.5–35.7)
MCV: 85 fL (ref 79–97)
Monocytes Absolute: 0.8 10*3/uL (ref 0.1–0.9)
Monocytes: 10 %
Neutrophils Absolute: 3.6 10*3/uL (ref 1.4–7.0)
Neutrophils: 46 %
Platelets: 320 10*3/uL (ref 150–450)
RBC: 5.35 x10E6/uL (ref 4.14–5.80)
RDW: 12.7 % (ref 11.6–15.4)
WBC: 7.7 10*3/uL (ref 3.4–10.8)

## 2019-07-16 LAB — COMPREHENSIVE METABOLIC PANEL
ALT: 24 IU/L (ref 0–44)
AST: 19 IU/L (ref 0–40)
Albumin/Globulin Ratio: 1.9 (ref 1.2–2.2)
Albumin: 4.8 g/dL (ref 4.0–5.0)
Alkaline Phosphatase: 64 IU/L (ref 39–117)
BUN/Creatinine Ratio: 18 (ref 9–20)
BUN: 16 mg/dL (ref 6–20)
Bilirubin Total: 0.4 mg/dL (ref 0.0–1.2)
CO2: 26 mmol/L (ref 20–29)
Calcium: 10.1 mg/dL (ref 8.7–10.2)
Chloride: 100 mmol/L (ref 96–106)
Creatinine, Ser: 0.89 mg/dL (ref 0.76–1.27)
GFR calc Af Amer: 125 mL/min/{1.73_m2} (ref >59)
GFR calc non Af Amer: 108 mL/min/{1.73_m2} (ref >59)
Globulin, Total: 2.5 g/dL (ref 1.5–4.5)
Glucose: 76 mg/dL (ref 65–99)
Potassium: 4.2 mmol/L (ref 3.5–5.2)
Sodium: 142 mmol/L (ref 134–144)
Total Protein: 7.3 g/dL (ref 6.0–8.5)

## 2019-08-12 ENCOUNTER — Other Ambulatory Visit: Payer: Self-pay

## 2019-08-12 ENCOUNTER — Ambulatory Visit: Payer: BC Managed Care – PPO | Admitting: Family Medicine

## 2020-10-25 ENCOUNTER — Ambulatory Visit (INDEPENDENT_AMBULATORY_CARE_PROVIDER_SITE_OTHER): Payer: PRIVATE HEALTH INSURANCE

## 2020-10-25 ENCOUNTER — Ambulatory Visit (HOSPITAL_COMMUNITY)
Admission: EM | Admit: 2020-10-25 | Discharge: 2020-10-25 | Disposition: A | Discharge status: Home / Self Care | Payer: PRIVATE HEALTH INSURANCE | Attending: Emergency Medicine | Admitting: Emergency Medicine

## 2020-10-25 ENCOUNTER — Encounter (HOSPITAL_COMMUNITY): Payer: Self-pay | Admitting: Emergency Medicine

## 2020-10-25 ENCOUNTER — Other Ambulatory Visit: Payer: Self-pay

## 2020-10-25 DIAGNOSIS — M47812 Spondylosis without myelopathy or radiculopathy, cervical region: Secondary | ICD-10-CM

## 2020-10-25 DIAGNOSIS — M4802 Spinal stenosis, cervical region: Secondary | ICD-10-CM

## 2020-10-25 DIAGNOSIS — M542 Cervicalgia: Secondary | ICD-10-CM | POA: Diagnosis not present

## 2020-10-25 MED ORDER — PREDNISONE 10 MG (21) PO TBPK: ORAL_TABLET | Freq: Every day | ORAL | 0 refills | Status: AC

## 2020-10-25 NOTE — ED Triage Notes (Signed)
Pt presents with neck pain that started yesterday while bench pressing at the gym. States the pain was a shooting pain that started in his neck and went into his head. States happened one other time last night. States went to gym this morning to lift and pain started again, so he stopped lifting.   Pt states for the last 3-4 days has had lightheadedness episodes. States is constantly looking up and down with his job. States drinks 5-6 cups of coffee a day.

## 2020-10-25 NOTE — ED Provider Notes (Signed)
[facility]    CSN: [id number] Arrival date & time: 10/25/20  1124      History   Chief Complaint Chief Complaint  Patient presents with   Neck Pain   Dizziness    HPI Kyle Ortiz is a 40 y.o. male.   Patient here for evaluation of left neck pain that started while working out yesterday.  Reports bench pressing when developed sharp shooting pain to his left neck that radiated to the base of his skull.  Reports pain resolved shortly after stopping weightlifting.  Developed the same pain overnight while moving and again this morning when lifting weights.  Also reports having dizziness and lightheadedness intermittently for the past several days.  Denies any headaches, decreased range of motion in arm or shoulders, or numbness/tingling into arms.  Symptoms have resolved at this time.  Has taken Ibuprofen for pain relief but denies any other medication or treatments.  Denies any trauma, injury, or other precipitating event.  Denies any fevers, chest pain, shortness of breath, N/V/D, numbness, tingling, weakness, abdominal pain, or headaches.    The history is provided by the patient.  Neck Pain Associated symptoms: no fever   Dizziness  History reviewed. No pertinent past medical history.  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 Authorizing Provider  predniSONE (STERAPRED UNI-PAK 21 TAB) 10 MG (21) TBPK tablet Take by mouth daily. Take 6 tabs by mouth  daily  for 2 days, then 5 tabs for 2 days, then 4 tabs for 2 days, then 3 tabs for 2 days, 2 tabs for 2 days, then 1 tab by mouth daily for 2 days 10/25/20  Yes Ivette Loyal, NP    Family History Family History  Problem Relation Age of Onset   HIV/AIDS Mother    Healthy Father    Cancer Neg Hx    Diabetes Neg Hx    Heart failure Neg Hx    Hyperlipidemia Neg Hx     Social History Social History   Tobacco Use   Smoking status: Former    Pack years: 0.00   Smokeless tobacco: Never  Vaping Use   Vaping Use: Never used  Substance Use Topics   Alcohol use: Yes    Comment: Occasional   Drug use: Yes    Types: Marijuana    Comment: daily      Allergies   Patient has no known allergies.   Review of Systems Review of Systems  Constitutional:  Negative for fever.  Musculoskeletal:  Positive for neck pain. Negative for neck stiffness.  Neurological:  Positive for dizziness.  All other systems reviewed and are negative.   Physical Exam Triage Vital Signs ED Triage Vitals  Enc Vitals Group     BP 10/25/20 1158 (!) 154/110     Pulse Rate 10/25/20 1158 67     Resp 10/25/20 1158 16     Temp 10/25/20 1158 98.1  F (36.7 C)     Temp Source 10/25/20 1158 Oral     SpO2 10/25/20 1158 100 %     Weight --      Height --      Head Circumference --      Peak Flow --      Pain Score 10/25/20 1156 3     Pain Loc --      Pain Edu? --      Excl. in GC? --    No data found.  Updated Vital Signs BP (!) 154/110 (BP Location: Left Arm)   Pulse 67   Temp 98.1 F (36.7 C) (Oral)   Resp 16   SpO2 100%   Visual Acuity Right Eye Distance:   Left Eye Distance:   Bilateral Distance:    Right Eye Near:   Left Eye Near:    Bilateral Near:     Physical Exam Vitals and nursing note reviewed.  Constitutional:      General: He is not in acute distress.    Appearance: Normal appearance. He is not ill-appearing, toxic-appearing or diaphoretic.  HENT:     Head: Normocephalic  and atraumatic.  Eyes:     Conjunctiva/sclera: Conjunctivae normal.  Cardiovascular:     Rate and Rhythm: Normal rate and regular rhythm.     Chest Wall: No thrill.     Pulses: Normal pulses.     Heart sounds: Normal heart sounds.  Pulmonary:     Effort: Pulmonary effort is normal.     Breath sounds: Normal breath sounds.  Abdominal:     General: Abdomen is flat.  Musculoskeletal:        General: Normal range of motion.     Cervical back: Normal range of motion. Tenderness (left trapezius) present. No swelling, edema, deformity, erythema, rigidity or bony tenderness. No pain with movement. Normal range of motion.     Thoracic back: Normal.     Lumbar back: Normal.     Right lower leg: No edema.     Left lower leg: No edema.  Skin:    General: Skin is warm and dry.  Neurological:     General: No focal deficit present.     Mental Status: He is alert and oriented to person, place, and time.  Psychiatric:        Mood and Affect: Mood normal.     UC Treatments / Results  Labs (all labs ordered are listed, but only abnormal results are displayed) Labs Reviewed - No data to display  EKG   Radiology DG Cervical Spine Complete  Result Date: 10/25/2020 CLINICAL DATA:  Neck pain beginning yesterday while weight lifting. EXAM: CERVICAL SPINE - COMPLETE 4+ VIEW COMPARISON:  None. FINDINGS: Vertebral body alignment and heights are normal. There is minimal spondylosis of the mid to lower cervical spine. There is mild disc space narrowing at the C6-7 level. Moderate right-sided uncovertebral joint spurring at the C6-7 level. Severe right-sided neural foraminal narrowing at the C6-7 level and mild to moderate left-sided neural foraminal narrowing at the same level. Atlantoaxial articulation is normal. Prevertebral soft tissues are normal. IMPRESSION: Minimal spondylosis of the mid to lower cervical spine with disc disease at the C6-7 level. Bilateral neural foraminal narrowing at the C6-7  level which is severe on the right. Electronically Signed   By: Elberta Fortis M.D.   On: 10/25/2020 13:02    Procedures Procedures (including critical care time)  Medications Ordered in UC Medications - No  data to display  Initial Impression / Assessment and Plan / UC Course  I have reviewed the triage vital signs and the nursing notes.  Pertinent labs & imaging results that were available during my care of the patient were reviewed by me and considered in my medical decision making (see chart for details).    X-ray shows minimal spondylosis of the mid to lower cervical spine and bilateral neural foraminal narrowing at the C6-7 level which is severe on the right.  Prednisone taper prescribed.  Directed patient to avoid any exercises that cause pain.  Patient given information on spondylosis and foraminal stenosis.  Follow-up with orthopedics or a spine specialist soon as possible.  Strict ED follow-up for any worsening symptoms. Final Clinical Impressions(s) / UC Diagnoses   Final diagnoses:  Cervical spondylosis without myelopathy  Foraminal stenosis of cervical region     Discharge Instructions      Take the prednisone taper as prescribed.    DO not do any exercises that cause pain, numbness, or lightheadedness.   Follow up with orthopedics or a spine specialist as soon as possible.    Return or go to the Emergency Department if symptoms worsen or do not improve in the next few days.      ED Prescriptions     Medication Sig Dispense Auth. Provider   predniSONE (STERAPRED UNI-PAK 21 TAB) 10 MG (21) TBPK tablet Take by mouth daily. Take 6 tabs by mouth daily  for 2 days, then 5 tabs for 2 days, then 4 tabs for 2 days, then 3 tabs for 2 days, 2 tabs for 2 days, then 1 tab by mouth daily for 2 days 42 tablet Ivette Loyal, NP      PDMP not reviewed this encounter.   Ivette Loyal, NP 10/25/20 1346

## 2020-10-25 NOTE — Discharge Instructions (Addendum)
Take the prednisone taper as prescribed.    DO not do any exercises that cause pain, numbness, or lightheadedness.   Follow up with orthopedics or a spine specialist as soon as possible.    Return or go to the Emergency Department if symptoms worsen or do not improve in the next few days.

## 2022-01-25 IMAGING — DX DG CERVICAL SPINE COMPLETE 4+V
5 series · 5 of 5 positions shown · non-contrast
Comparison: None.

CLINICAL DATA: Neck pain beginning yesterday while weight lifting.

EXAM:
CERVICAL SPINE - COMPLETE 4+ VIEW

[c-spine lat]
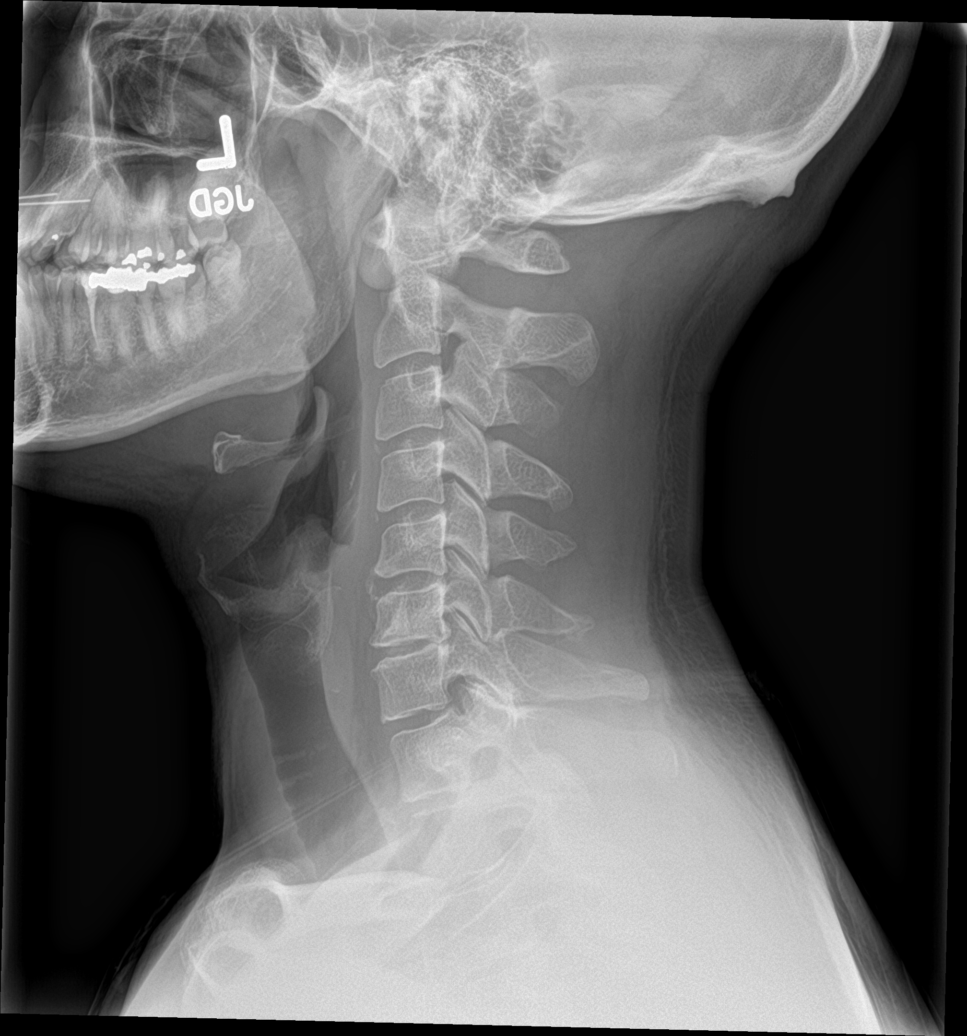

[c-spine obl (1 of 2)]
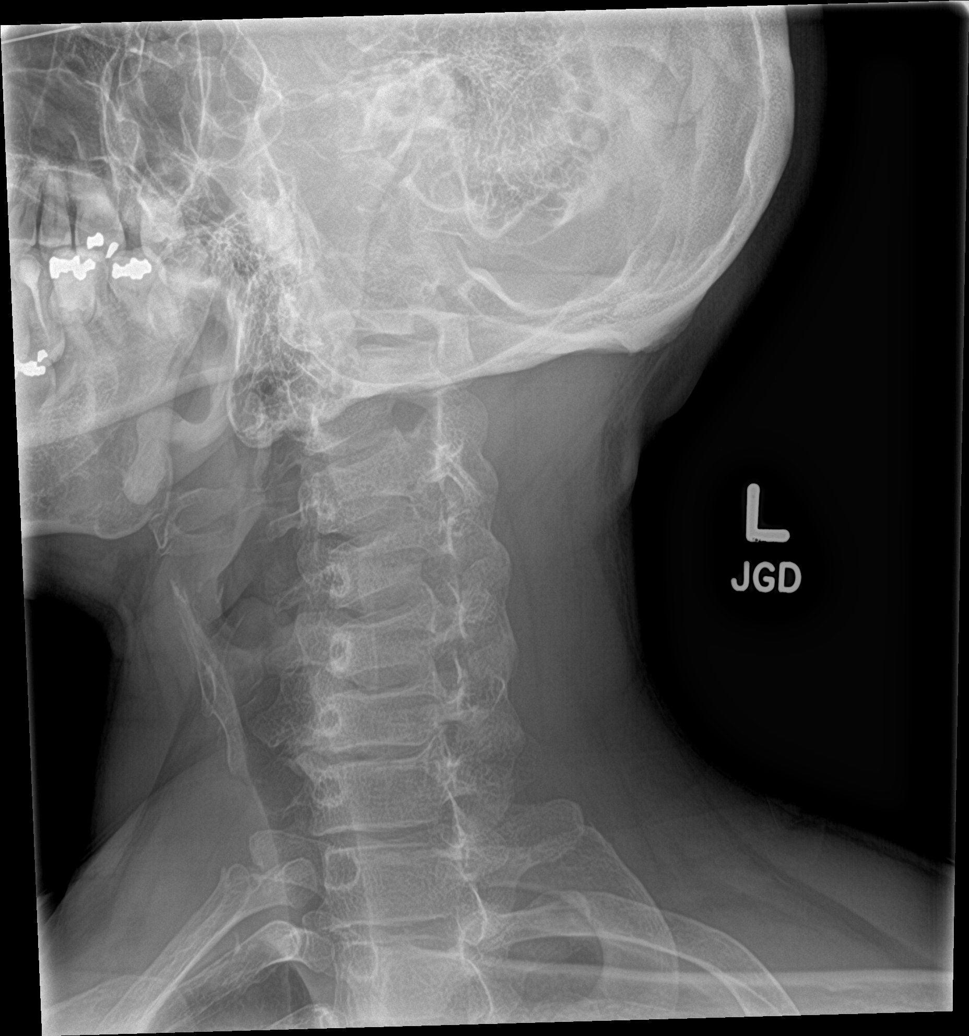

[c-spine obl (2 of 2)]
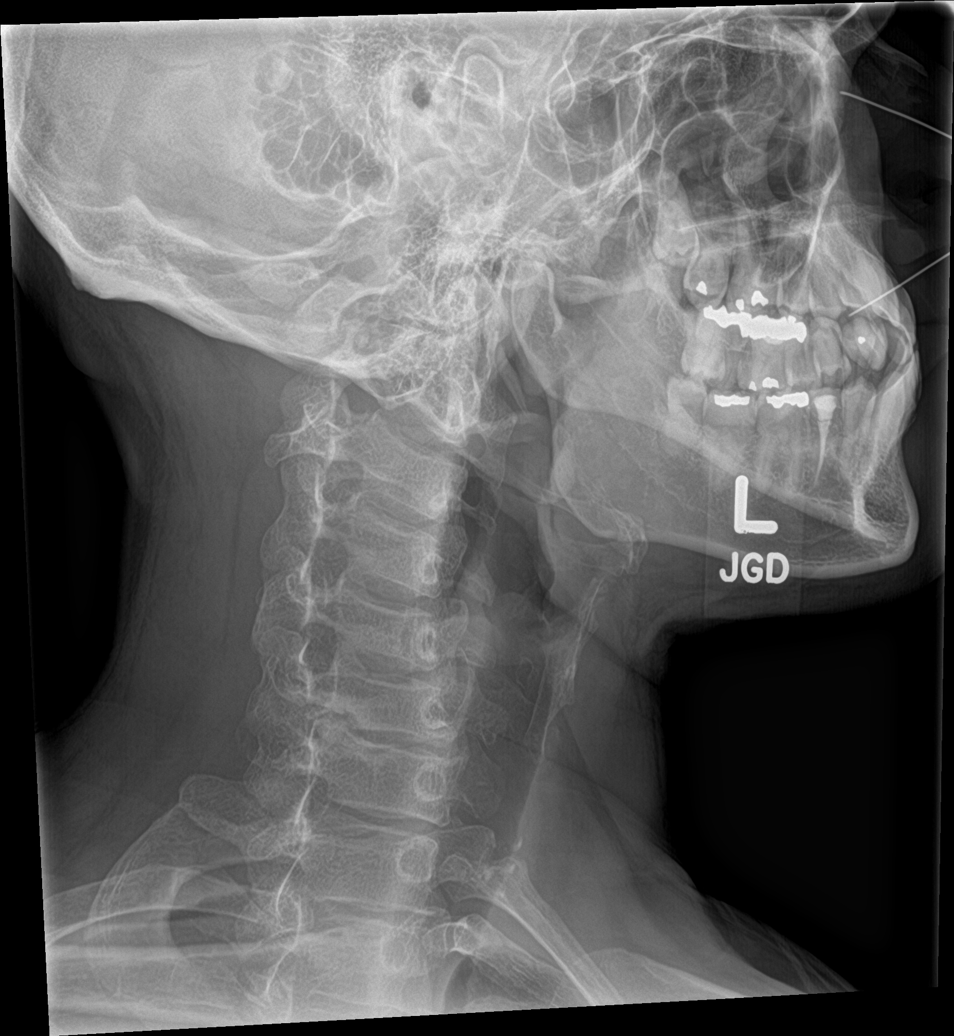

[c-spine ap]
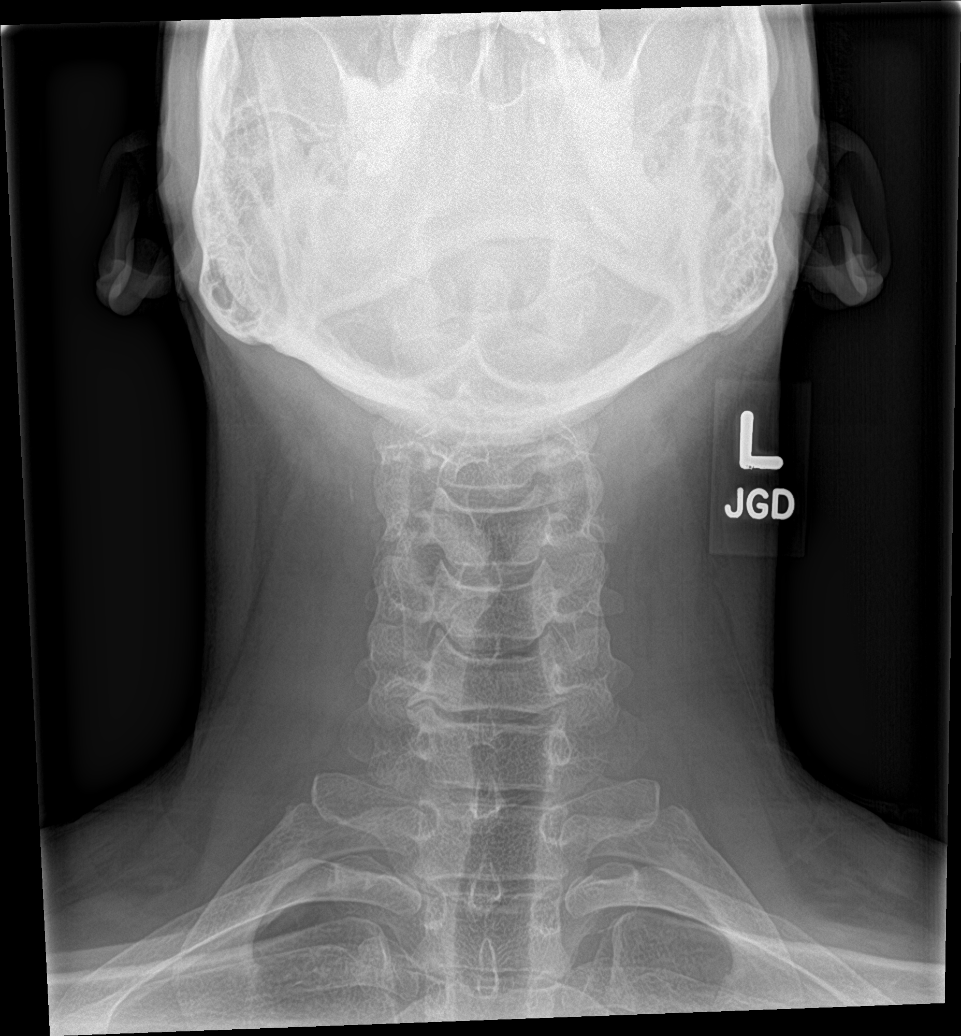

[c-spine open mouth]
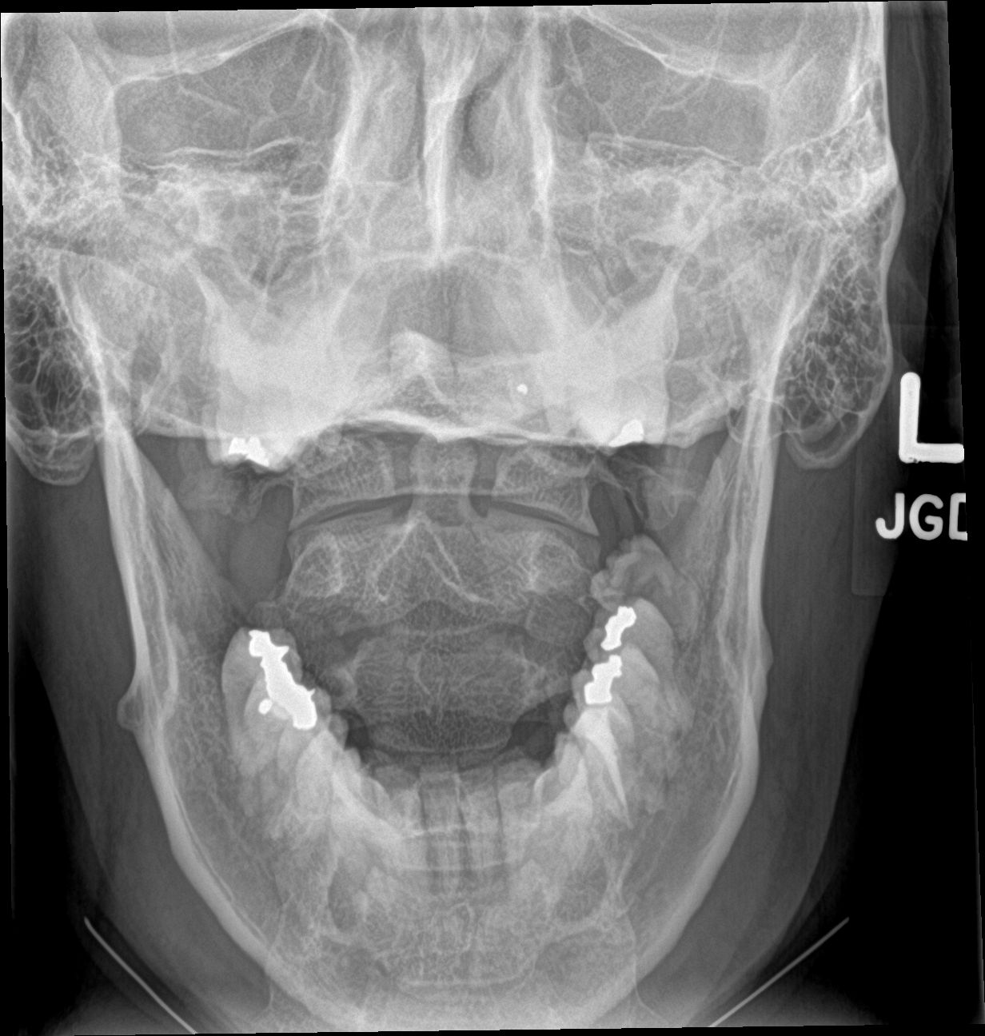

[5 of 5 positions shown; findings below may reference images not displayed]

FINDINGS: Vertebral body alignment and heights are normal. There is minimal
spondylosis of the mid to lower cervical spine. There is mild disc
space narrowing at the C6-7 level. Moderate right-sided
uncovertebral joint spurring at the C6-7 level. Severe right-sided
neural foraminal narrowing at the C6-7 level and mild to moderate
left-sided neural foraminal narrowing at the same level.
Atlantoaxial articulation is normal. Prevertebral soft tissues are
normal.
IMPRESSION: Minimal spondylosis of the mid to lower cervical spine with disc
disease at the C6-7 level. Bilateral neural foraminal narrowing at
the C6-7 level which is severe on the right.
